# Patient Record
Sex: Female | Born: 1960 | Race: White | Hispanic: No | Marital: Married | Smoking: Never smoker
Health system: Southern US, Community
[De-identification: ages and names within clinical notes are randomized; demographics above are authoritative.]

## PROBLEM LIST (undated history)

## (undated) DIAGNOSIS — K6289 Other specified diseases of anus and rectum: Secondary | ICD-10-CM

## (undated) DIAGNOSIS — K625 Hemorrhage of anus and rectum: Secondary | ICD-10-CM

## (undated) DIAGNOSIS — K645 Perianal venous thrombosis: Secondary | ICD-10-CM

## (undated) DIAGNOSIS — S42309A Unspecified fracture of shaft of humerus, unspecified arm, initial encounter for closed fracture: Secondary | ICD-10-CM

## (undated) HISTORY — DX: Unspecified fracture of shaft of humerus, unspecified arm, initial encounter for closed fracture: S42.309A

## (undated) HISTORY — DX: Hemorrhage of anus and rectum: K62.5

## (undated) HISTORY — DX: Perianal venous thrombosis: K64.5

## (undated) HISTORY — DX: Other specified diseases of anus and rectum: K62.89

---
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
## 1998-01-16 ENCOUNTER — Other Ambulatory Visit: Admission: RE | Admit: 1998-01-16 | Discharge: 1998-01-16 | Payer: Self-pay | Admitting: Obstetrics & Gynecology

## 1999-01-24 ENCOUNTER — Other Ambulatory Visit: Admission: RE | Admit: 1999-01-24 | Discharge: 1999-01-24 | Payer: Self-pay | Admitting: Obstetrics & Gynecology

## 2000-02-02 ENCOUNTER — Other Ambulatory Visit: Admission: RE | Admit: 2000-02-02 | Discharge: 2000-02-02 | Payer: Self-pay | Admitting: Obstetrics & Gynecology

## 2001-02-01 ENCOUNTER — Other Ambulatory Visit: Admission: RE | Admit: 2001-02-01 | Discharge: 2001-02-01 | Payer: Self-pay | Admitting: Obstetrics & Gynecology

## 2002-04-10 ENCOUNTER — Other Ambulatory Visit: Admission: RE | Admit: 2002-04-10 | Discharge: 2002-04-10 | Payer: Self-pay | Admitting: Obstetrics & Gynecology

## 2003-04-18 ENCOUNTER — Other Ambulatory Visit: Admission: RE | Admit: 2003-04-18 | Discharge: 2003-04-18 | Payer: Self-pay | Admitting: Obstetrics & Gynecology

## 2004-04-30 ENCOUNTER — Other Ambulatory Visit: Admission: RE | Admit: 2004-04-30 | Discharge: 2004-04-30 | Payer: Self-pay | Admitting: Obstetrics & Gynecology

## 2004-07-11 ENCOUNTER — Ambulatory Visit (HOSPITAL_COMMUNITY): Admission: RE | Admit: 2004-07-11 | Discharge: 2004-07-11 | Payer: Self-pay | Admitting: Family Medicine

## 2005-05-18 ENCOUNTER — Other Ambulatory Visit: Admission: RE | Admit: 2005-05-18 | Discharge: 2005-05-18 | Payer: Self-pay | Admitting: Obstetrics & Gynecology

## 2007-06-16 ENCOUNTER — Encounter: Admission: RE | Admit: 2007-06-16 | Discharge: 2007-06-16 | Payer: Self-pay | Admitting: Obstetrics & Gynecology

## 2007-06-16 ENCOUNTER — Encounter (INDEPENDENT_AMBULATORY_CARE_PROVIDER_SITE_OTHER): Payer: Self-pay | Admitting: Radiology

## 2008-06-06 ENCOUNTER — Encounter: Admission: RE | Admit: 2008-06-06 | Discharge: 2008-06-06 | Payer: Self-pay | Admitting: Obstetrics & Gynecology

## 2009-06-12 ENCOUNTER — Encounter: Admission: RE | Admit: 2009-06-12 | Discharge: 2009-06-12 | Payer: Self-pay | Admitting: Obstetrics & Gynecology

## 2010-06-23 ENCOUNTER — Encounter: Admission: RE | Admit: 2010-06-23 | Discharge: 2010-06-23 | Payer: Self-pay | Admitting: Obstetrics & Gynecology

## 2010-06-27 ENCOUNTER — Encounter: Admission: RE | Admit: 2010-06-27 | Discharge: 2010-06-27 | Payer: Self-pay | Admitting: Obstetrics & Gynecology

## 2010-10-11 ENCOUNTER — Encounter: Payer: Self-pay | Admitting: Obstetrics & Gynecology

## 2011-05-13 ENCOUNTER — Other Ambulatory Visit: Payer: Self-pay | Admitting: Obstetrics & Gynecology

## 2011-05-13 DIAGNOSIS — Z1231 Encounter for screening mammogram for malignant neoplasm of breast: Secondary | ICD-10-CM

## 2011-06-11 ENCOUNTER — Ambulatory Visit (INDEPENDENT_AMBULATORY_CARE_PROVIDER_SITE_OTHER): Payer: PRIVATE HEALTH INSURANCE | Admitting: General Surgery

## 2011-06-11 ENCOUNTER — Encounter (INDEPENDENT_AMBULATORY_CARE_PROVIDER_SITE_OTHER): Payer: Self-pay | Admitting: General Surgery

## 2011-06-11 VITALS — BP 130/80 | HR 68 | Temp 97.2°F | Resp 16 | Ht 63.5 in | Wt 150.0 lb

## 2011-06-11 DIAGNOSIS — K645 Perianal venous thrombosis: Secondary | ICD-10-CM

## 2011-06-11 HISTORY — PX: HEMORROIDECTOMY: SUR656

## 2011-06-11 NOTE — Progress Notes (Signed)
Subjective:     Patient ID: Erika Ray, female   DOB: May 05, 1961, 50 y.o.   MRN: [id number]  HPI Patient with a one day history of increasing hemorrhoid pain. She was evaluated by her primary care physician. He found a thrombosed external hemorrhoid. We are seen her in the urgent clinic today. She has had no drainage. His history of some hemorrhoidal disease, however it has never been this bad in the past.  Review of Systems  Constitutional: Negative.   HENT: Negative.   Eyes: Negative.   Cardiovascular: Negative.   Genitourinary: Negative.   Musculoskeletal: Negative.        Objective:   Physical Exam  Eyes: Conjunctivae are normal. Pupils are equal, round, and reactive to light.  Cardiovascular: Normal rate and normal heart sounds.   Pulmonary/Chest: Effort normal and breath sounds normal. She has no wheezes.  Abdominal: Soft. She exhibits no distension. There is no tenderness.  External anal exam reveals large hemorrhoid with 2 thromboses on the right side. Pain limited for the rectal exam. There is some skin tags on the left side but no acute inflammation. There is no evidence of abscess. Procedure note: The area was prepped in a sterile fashion, local anesthetic was injected. 2 incisions were made over the 2 areas of thrombosis and large thrombosed external hemorrhoid large clot was removed from each side. Hemostasis was obtained with pressure and a gauze dressing was applied     Assessment:     Thrombosed external hemorrhoid    Plan:     This was incised as above. Sitz bath instructions were given. Local wound care. We'll see her back in a couple weeks. I gave her a note to be out of work tomorrow.

## 2011-06-12 ENCOUNTER — Telehealth (INDEPENDENT_AMBULATORY_CARE_PROVIDER_SITE_OTHER): Payer: Self-pay

## 2011-06-12 DIAGNOSIS — K645 Perianal venous thrombosis: Secondary | ICD-10-CM

## 2011-06-12 MED ORDER — HYDROCODONE-ACETAMINOPHEN 5-325 MG PO TABS: 1.0000 | ORAL_TABLET | ORAL | Status: DC | PRN

## 2011-06-12 NOTE — Telephone Encounter (Signed)
Pt called and is having some dull pain after her procedure yesterday for thrombosed hemorrhoids.  I spoke to Dr Janee Morn who gave an order for Hydrocodone protocol.  That was called in to the [facility] [phone]

## 2011-06-18 ENCOUNTER — Encounter (INDEPENDENT_AMBULATORY_CARE_PROVIDER_SITE_OTHER): Payer: Self-pay | Admitting: General Surgery

## 2011-06-18 ENCOUNTER — Ambulatory Visit (INDEPENDENT_AMBULATORY_CARE_PROVIDER_SITE_OTHER): Payer: PRIVATE HEALTH INSURANCE | Admitting: General Surgery

## 2011-06-18 VITALS — BP 118/80 | HR 68 | Temp 98.3°F | Resp 16 | Ht 63.5 in | Wt 151.2 lb

## 2011-06-18 DIAGNOSIS — K644 Residual hemorrhoidal skin tags: Secondary | ICD-10-CM

## 2011-06-18 MED ORDER — LIDOCAINE HCL 2 % EX GEL: CUTANEOUS | Status: AC

## 2011-06-18 NOTE — Patient Instructions (Signed)
GETTING TO GOOD BOWEL HEALTH. Irregular bowel habits such as constipation and diarrhea can lead to many problems over time.  Having one soft bowel movement a day is the most important way to prevent further problems.  The anorectal canal is designed to handle stretching and feces to safely manage our ability to get rid of solid waste (feces, poop, stool) out of our body.  BUT, hard constipated stools can act like ripping concrete bricks and diarrhea can be a burning fire to this very sensitive area of our body, causing inflamed hemorrhoids, anal fissures, increasing risk is perirectal abscesses, abdominal pain/bloating, an making irritable bowel worse.     The goal: ONE SOFT BOWEL MOVEMENT A DAY!  To have soft, regular bowel movements:    Drink at least 8 tall glasses of water a day.     Take plenty of fiber.  Fiber is the undigested part of plant food that passes into the colon, acting s "natures broom" to encourage bowel motility and movement.  Fiber can absorb and hold large amounts of water. This results in a larger, bulkier stool, which is soft and easier to pass. Work gradually over several weeks up to 6 servings a day of fiber (25g a day even more if needed) in the form of: o Vegetables -- Root (potatoes, carrots, turnips), leafy green (lettuce, salad greens, celery, spinach), or cooked high residue (cabbage, broccoli, etc) o Fruit -- Fresh (unpeeled skin & pulp), Dried (prunes, apricots, cherries, etc ),  or stewed ( applesauce)  o Whole grain breads, pasta, etc (whole wheat)  o Bran cereals    Bulking Agents -- This type of water-retaining fiber generally is easily obtained each day by one of the following:  o Psyllium bran -- The psyllium plant is remarkable because its ground seeds can retain so much water. This product is available as Metamucil, Konsyl, Effersyllium, Per Diem Fiber, or the less expensive generic preparation in drug and health food stores. Although labeled a laxative, it really  is not a laxative.  o Methylcellulose -- This is another fiber derived from wood which also retains water. It is available as Citrucel. o Polyethylene Glycol - and "artificial" fiber commonly called Miralax or Glycolax.  It is helpful for people with gassy or bloated feelings with regular fiber o Flax Seed - a less gassy fiber than psyllium o Benefiber   No reading or other relaxing activity while on the toilet. If bowel movements take longer than 5 minutes, you are too constipated   AVOID CONSTIPATION.  High fiber and water intake usually takes care of this.  Sometimes a laxative is needed to stimulate more frequent bowel movements, but    Laxatives are not a good long-term solution as it can wear the colon out. o Osmotics (Milk of Magnesia, Fleets phosphosoda, Magnesium citrate, MiraLax, GoLytely) are safer than  o Stimulants (Senokot, Castor Oil, Dulcolax, Ex Lax)    o Do not take laxatives for more than 7days in a row.    IF SEVERELY CONSTIPATED, try a Bowel Retraining Program: o Do not use laxatives.  o Eat a diet high in roughage, such as bran cereals and leafy vegetables.  o Drink six (6) ounces of prune or apricot juice each morning.  o Eat two (2) large servings of stewed fruit each day.  o Take one (1) heaping tablespoon of a psyllium-based bulking agent twice a day. Use sugar-free sweetener when possible to avoid excessive calories.  o Eat a normal breakfast.  o Set aside 15 minutes after breakfast to sit on the toilet, but do not strain to have a bowel movement.  o If you do not have a bowel movement by the third day, use an enema and repeat the above steps.  o

## 2011-06-19 NOTE — Progress Notes (Signed)
Chief complaint: I'm still having pain  History of present illness: 50 year old female underwent an incision and drainage of a right thrombosed external hemorrhoid on September 20 comes in complaining of continued pain in the area as well as some recurrent bleeding. She states that area has not gone down in size. She is taking the pain medicine fairly frequently. She denies any fevers or chills. She denies any dysuria or trouble urinating. She is taking a sitz bath infrequently. She is also having some constipation.  Physical exam: BP 118/80  Pulse 68  Temp(Src) 98.3 F (36.8 C) (Temporal)  Resp 16  Ht 5' 3.5" (1.613 m)  Wt 151 lb 3.2 oz (68.584 kg)  BMI 26.36 kg/m2  Well-developed well-nourished overweight female in no apparent distress Rectal-she has some indurated right external hemorrhoidal tissue in the anterior and posterior position. There is no signs of thrombosis. There is no cellulitis. There is no fluctuance. Digital rectal exam deferred  Assessment and plan: Status post incision and drainage of right thrombosed external hemorrhoids  There is no signs of infection. I've encouraged to increase her water intake. I also encouraged her to add fiber to her diet. She was given information about fiber supplements. I've asked her to try to minimize the narcotics. She is given a prescription for lidocaine 2% ointment. I've also encouraged her to continue doing sitz baths at least 3-4 times a day. She will followup with Dr. Janee Morn.

## 2011-06-29 ENCOUNTER — Ambulatory Visit
Admission: RE | Admit: 2011-06-29 | Discharge: 2011-06-29 | Disposition: A | Discharge status: Home / Self Care | Payer: Self-pay | Source: Ambulatory Visit | Attending: Obstetrics & Gynecology | Admitting: Obstetrics & Gynecology

## 2011-06-29 DIAGNOSIS — Z1231 Encounter for screening mammogram for malignant neoplasm of breast: Secondary | ICD-10-CM

## 2011-07-15 ENCOUNTER — Encounter (INDEPENDENT_AMBULATORY_CARE_PROVIDER_SITE_OTHER): Payer: PRIVATE HEALTH INSURANCE | Admitting: General Surgery

## 2011-08-31 ENCOUNTER — Other Ambulatory Visit: Payer: Self-pay | Admitting: Gastroenterology

## 2012-05-24 ENCOUNTER — Other Ambulatory Visit: Payer: Self-pay | Admitting: Obstetrics & Gynecology

## 2012-05-24 DIAGNOSIS — Z1231 Encounter for screening mammogram for malignant neoplasm of breast: Secondary | ICD-10-CM

## 2012-07-15 ENCOUNTER — Ambulatory Visit
Admission: RE | Admit: 2012-07-15 | Discharge: 2012-07-15 | Disposition: A | Discharge status: Home / Self Care | Payer: PRIVATE HEALTH INSURANCE | Source: Ambulatory Visit | Attending: Obstetrics & Gynecology | Admitting: Obstetrics & Gynecology

## 2012-07-15 DIAGNOSIS — Z1231 Encounter for screening mammogram for malignant neoplasm of breast: Secondary | ICD-10-CM

## 2012-08-08 ENCOUNTER — Other Ambulatory Visit: Payer: Self-pay | Admitting: Gastroenterology

## 2013-05-15 ENCOUNTER — Other Ambulatory Visit: Payer: Self-pay

## 2013-05-15 DIAGNOSIS — Z1231 Encounter for screening mammogram for malignant neoplasm of breast: Secondary | ICD-10-CM

## 2013-06-07 ENCOUNTER — Encounter: Payer: Self-pay | Admitting: *Deleted

## 2013-06-26 ENCOUNTER — Encounter: Payer: Self-pay | Admitting: Family Medicine

## 2013-06-26 ENCOUNTER — Ambulatory Visit (INDEPENDENT_AMBULATORY_CARE_PROVIDER_SITE_OTHER): Payer: 59 | Admitting: Family Medicine

## 2013-06-26 VITALS — BP 116/69 | HR 69 | Temp 98.1°F | Ht 62.0 in | Wt 157.0 lb

## 2013-06-26 DIAGNOSIS — E559 Vitamin D deficiency, unspecified: Secondary | ICD-10-CM

## 2013-06-26 DIAGNOSIS — Z0000 Encounter for general adult medical examination without abnormal findings: Secondary | ICD-10-CM

## 2013-06-26 LAB — POCT CBC
Granulocyte percent: 74.7 %G (ref 37–80)
HCT, POC: 42.4 % (ref 37.7–47.9)
Hemoglobin: 14.3 g/dL (ref 12.2–16.2)
Lymph, poc: 2 (ref 0.6–3.4)
MCH, POC: 29.9 pg (ref 27–31.2)
MCHC: 33.7 g/dL (ref 31.8–35.4)
MCV: 88.9 fL (ref 80–97)
MPV: 7.8 fL (ref 0–99.8)
POC Granulocyte: 6.4 (ref 2–6.9)
POC LYMPH PERCENT: 22.9 %L (ref 10–50)
Platelet Count, POC: 249 10*3/uL (ref 142–424)
RBC: 4.8 M/uL (ref 4.04–5.48)
RDW, POC: 12.6 %
WBC: 8.6 10*3/uL (ref 4.6–10.2)

## 2013-06-26 NOTE — Patient Instructions (Addendum)
Colonoscopy  A colonoscopy is an exam to evaluate your entire colon. In this exam, your colon is cleansed. A long fiberoptic tube is inserted through your rectum and into your colon. The fiberoptic scope (endoscope) is a long bundle of enclosed and very flexible fibers. These fibers transmit light to the area examined and send images from that area to your caregiver. Discomfort is usually minimal. You may be given a drug to help you sleep (sedative) during or prior to the procedure. This exam helps to detect lumps (tumors), polyps, inflammation, and areas of bleeding. Your caregiver may also take a small piece of tissue (biopsy) that will be examined under a microscope.  LET YOUR CAREGIVER KNOW ABOUT:   · Allergies to food or medicine.  · Medicines taken, including vitamins, herbs, eyedrops, over-the-counter medicines, and creams.  · Use of steroids (by mouth or creams).  · Previous problems with anesthetics or numbing medicines.  · History of bleeding problems or blood clots.  · Previous surgery.  · Other health problems, including diabetes and kidney problems.  · Possibility of pregnancy, if this applies.  BEFORE THE PROCEDURE   · A clear liquid diet may be required for 2 days before the exam.  · Ask your caregiver about changing or stopping your regular medications.  · Liquid injections (enemas) or laxatives may be required.  · A large amount of electrolyte solution may be given to you to drink over a short period of time. This solution is used to clean out your colon.  · You should be present 60 minutes prior to your procedure or as directed by your caregiver.  AFTER THE PROCEDURE   · If you received a sedative or pain relieving medication, you will need to arrange for someone to drive you home.  · Occasionally, there is a little blood passed with the first bowel movement. Do not be concerned.  FINDING OUT THE RESULTS OF YOUR TEST  Not all test results are available during your visit. If your test results are  not back during the visit, make an appointment with your caregiver to find out the results. Do not assume everything is normal if you have not heard from your caregiver or the medical facility. It is important for you to follow up on all of your test results.  HOME CARE INSTRUCTIONS   · It is not unusual to pass moderate amounts of gas and experience mild abdominal cramping following the procedure. This is due to air being used to inflate your colon during the exam. Walking or a warm pack on your belly (abdomen) may help.  · You may resume all normal meals and activities after sedatives and medicines have worn off.  · Only take over-the-counter or prescription medicines for pain, discomfort, or fever as directed by your caregiver. Do not use aspirin or blood thinners if a biopsy was taken. Consult your caregiver for medicine usage if biopsies were taken.  SEEK IMMEDIATE MEDICAL CARE IF:   · You have a fever.  · You pass large blood clots or fill a toilet with blood following the procedure. This may also occur 10 to 14 days following the procedure. This is more likely if a biopsy was taken.  · You develop abdominal pain that keeps getting worse and cannot be relieved with medicine.  Document Released: 09/04/2000 Document Revised: 11/30/2011 Document Reviewed: 04/19/2008  ExitCare® Patient Information ©2014 [employer].

## 2013-06-26 NOTE — Progress Notes (Signed)
  Subjective:    Patient ID: Margeart Allender, female    DOB: [birth year], 52 y.o.   MRN: [id number]  HPI  This 52 y.o. female presents for evaluation of CPE.  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 She is due for CPE labs.  She has had colonoscopy and had a polyp.  She is scheduled to have another Colonoscopy in 5 years.  Her mother died from ovarian cancer at agde 32. She declines flu shot.  Review of Systems No chest pain, SOB, HA, dizziness, vision change, N/V, diarrhea, constipation, dysuria, urinary urgency or frequency, myalgias, arthralgias or rash.     Objective:   Physical Exam Vital signs noted  Well developed well nourished female.  HEENT - Head atraumatic Normocephalic                Eyes - PERRLA, Conjuctiva - clear Sclera- Clear EOMI                Ears - EAC's Wnl TM's Wnl Gross Hearing WNL                Nose - Nares patent                 Throat - oropharanx wnl Respiratory - Lungs CTA bilateral Cardiac - RRR S1 and S2 without murmur GI - Abdomen soft Nontender and bowel sounds active x 4 Extremities - No edema. Neuro - Grossly intact.       Assessment & Plan:  Routine general medical examination at a health care facility - Plan: POCT CBC, CMP14+EGFR, Lipid panel, Thyroid Panel With TSH, Vit D  25 hydroxy (rtn osteoporosis monitoring)  Unspecified vitamin D deficiency - Check Vitamin D level.  Continue vitamin D 1000 iu po qd.  Deatra Canter FNP

## 2013-06-27 ENCOUNTER — Ambulatory Visit: Payer: Self-pay | Admitting: Family Medicine

## 2013-06-27 LAB — THYROID PANEL WITH TSH
Free Thyroxine Index: 2 (ref 1.2–4.9)
T3 Uptake Ratio: 20 % — ABNORMAL LOW (ref 24–39)
T4, Total: 9.8 ug/dL (ref 4.5–12.0)
TSH: 2.68 u[IU]/mL (ref 0.450–4.500)

## 2013-06-27 LAB — CMP14+EGFR
ALT: 10 IU/L (ref 0–32)
AST: 12 IU/L (ref 0–40)
Albumin/Globulin Ratio: 2.2 (ref 1.1–2.5)
Albumin: 4.3 g/dL (ref 3.5–5.5)
Alkaline Phosphatase: 76 IU/L (ref 39–117)
BUN/Creatinine Ratio: 17 (ref 9–23)
BUN: 11 mg/dL (ref 6–24)
CO2: 23 mmol/L (ref 18–29)
Calcium: 9.5 mg/dL (ref 8.7–10.2)
Chloride: 102 mmol/L (ref 97–108)
Creatinine, Ser: 0.63 mg/dL (ref 0.57–1.00)
GFR calc Af Amer: 120 mL/min/{1.73_m2} (ref >59)
GFR calc non Af Amer: 104 mL/min/{1.73_m2} (ref >59)
Globulin, Total: 2 g/dL (ref 1.5–4.5)
Glucose: 85 mg/dL (ref 65–99)
Potassium: 4.5 mmol/L (ref 3.5–5.2)
Sodium: 141 mmol/L (ref 134–144)
Total Bilirubin: 0.4 mg/dL (ref 0.0–1.2)
Total Protein: 6.3 g/dL (ref 6.0–8.5)

## 2013-06-27 LAB — LIPID PANEL
Chol/HDL Ratio: 2.6 ratio units (ref 0.0–4.4)
Cholesterol, Total: 272 mg/dL — ABNORMAL HIGH (ref 100–199)
HDL: 106 mg/dL (ref >39)
LDL Calculated: 146 mg/dL — ABNORMAL HIGH (ref 0–99)
Triglycerides: 102 mg/dL (ref 0–149)
VLDL Cholesterol Cal: 20 mg/dL (ref 5–40)

## 2013-06-27 LAB — VITAMIN D 25 HYDROXY (VIT D DEFICIENCY, FRACTURES): Vit D, 25-Hydroxy: 55 ng/mL (ref 30.0–100.0)

## 2013-06-28 ENCOUNTER — Ambulatory Visit: Payer: Self-pay | Admitting: Family Medicine

## 2013-07-19 ENCOUNTER — Ambulatory Visit
Admission: RE | Admit: 2013-07-19 | Discharge: 2013-07-19 | Disposition: A | Discharge status: Home / Self Care | Payer: PRIVATE HEALTH INSURANCE | Source: Ambulatory Visit

## 2013-07-19 ENCOUNTER — Other Ambulatory Visit: Payer: Self-pay

## 2013-07-19 DIAGNOSIS — Z1231 Encounter for screening mammogram for malignant neoplasm of breast: Secondary | ICD-10-CM

## 2013-07-28 ENCOUNTER — Encounter: Payer: Self-pay | Admitting: Family Medicine

## 2013-07-28 ENCOUNTER — Ambulatory Visit (INDEPENDENT_AMBULATORY_CARE_PROVIDER_SITE_OTHER): Payer: 59 | Admitting: Family Medicine

## 2013-07-28 VITALS — BP 109/67 | HR 80 | Temp 98.6°F | Ht 62.0 in | Wt 158.0 lb

## 2013-07-28 DIAGNOSIS — K645 Perianal venous thrombosis: Secondary | ICD-10-CM

## 2013-07-28 MED ORDER — HYDROCODONE-ACETAMINOPHEN 5-325 MG PO TABS: 1.0000 | ORAL_TABLET | ORAL | Status: AC | PRN

## 2013-07-28 MED ORDER — CYCLOBENZAPRINE HCL 10 MG PO TABS: 10.0000 mg | ORAL_TABLET | Freq: Three times a day (TID) | ORAL | Status: DC | PRN

## 2013-07-28 NOTE — Patient Instructions (Signed)
Back Pain, Adult Low back pain is very common. About 1 in 5 people have back pain.The cause of low back pain is rarely dangerous. The pain often gets better over time.About half of people with a sudden onset of back pain feel better in just 2 weeks. About 8 in 10 people feel better by 6 weeks.  CAUSES Some common causes of back pain include:  Strain of the muscles or ligaments supporting the spine.  Wear and tear (degeneration) of the spinal discs.  Arthritis.  Direct injury to the back. DIAGNOSIS Most of the time, the direct cause of low back pain is not known.However, back pain can be treated effectively even when the exact cause of the pain is unknown.Answering your caregiver's questions about your overall health and symptoms is one of the most accurate ways to make sure the cause of your pain is not dangerous. If your caregiver needs more information, he or she may order lab work or imaging tests (X-rays or MRIs).However, even if imaging tests show changes in your back, this usually does not require surgery. HOME CARE INSTRUCTIONS For many people, back pain returns.Since low back pain is rarely dangerous, it is often a condition that people can learn to manageon their own.   Remain active. It is stressful on the back to sit or stand in one place. Do not sit, drive, or stand in one place for more than 30 minutes at a time. Take short walks on level surfaces as soon as pain allows.Try to increase the length of time you walk each day.  Do not stay in bed.Resting more than 1 or 2 days can delay your recovery.  Do not avoid exercise or work.Your body is made to move.It is not dangerous to be active, even though your back may hurt.Your back will likely heal faster if you return to being active before your pain is gone.  Pay attention to your body when you bend and lift. Many people have less discomfortwhen lifting if they bend their knees, keep the load close to their bodies,and  avoid twisting. Often, the most comfortable positions are those that put less stress on your recovering back.  Find a comfortable position to sleep. Use a firm mattress and lie on your side with your knees slightly bent. If you lie on your back, put a pillow under your knees.  Only take over-the-counter or prescription medicines as directed by your caregiver. Over-the-counter medicines to reduce pain and inflammation are often the most helpful.Your caregiver may prescribe muscle relaxant drugs.These medicines help dull your pain so you can more quickly return to your normal activities and healthy exercise.  Put ice on the injured area.  Put ice in a plastic bag.  Place a towel between your skin and the bag.  Leave the ice on for 15-20 minutes, 03-04 times a day for the first 2 to 3 days. After that, ice and heat may be alternated to reduce pain and spasms.  Ask your caregiver about trying back exercises and gentle massage. This may be of some benefit.  Avoid feeling anxious or stressed.Stress increases muscle tension and can worsen back pain.It is important to recognize when you are anxious or stressed and learn ways to manage it.Exercise is a great option. SEEK MEDICAL CARE IF:  You have pain that is not relieved with rest or medicine.  You have pain that does not improve in 1 week.  You have new symptoms.  You are generally not feeling well. SEEK   IMMEDIATE MEDICAL CARE IF:   You have pain that radiates from your back into your legs.  You develop new bowel or bladder control problems.  You have unusual weakness or numbness in your arms or legs.  You develop nausea or vomiting.  You develop abdominal pain.  You feel faint. Document Released: 09/07/2005 Document Revised: 03/08/2012 Document Reviewed: 01/26/2011 ExitCare Patient Information 2014 [employer].  

## 2013-07-28 NOTE — Progress Notes (Signed)
  Subjective:    Patient ID: Erika Ray, female    DOB: 03/16/1961, 52 y.o.   MRN: [id number]  HPI This 52 y.o. female presents for evaluation of back pain in her thoracic spine. She has hx of injury in the past.  She has been laying on heating pad.  She does a lot Of bending at work.   Review of Systems C/o back pain No chest pain, SOB, HA, dizziness, vision change, N/V, diarrhea, constipation, dysuria, urinary urgency or frequency or rash.     Objective:   Physical Exam MS - TTP thoracic spine and decreased rom.       Assessment & Plan:  Back pain - flexeril and naprosyn and if not better follow up.  Deatra Canter FNP

## 2013-08-02 ENCOUNTER — Ambulatory Visit: Payer: 59 | Admitting: Family Medicine

## 2014-05-24 ENCOUNTER — Other Ambulatory Visit: Payer: Self-pay

## 2014-05-24 DIAGNOSIS — Z1231 Encounter for screening mammogram for malignant neoplasm of breast: Secondary | ICD-10-CM

## 2014-07-05 ENCOUNTER — Encounter: Payer: Self-pay | Admitting: Family Medicine

## 2014-07-05 ENCOUNTER — Ambulatory Visit (INDEPENDENT_AMBULATORY_CARE_PROVIDER_SITE_OTHER): Payer: 59 | Admitting: Family Medicine

## 2014-07-05 VITALS — BP 114/72 | HR 73 | Temp 97.5°F | Ht 62.0 in | Wt 149.6 lb

## 2014-07-05 DIAGNOSIS — R5383 Other fatigue: Secondary | ICD-10-CM

## 2014-07-05 DIAGNOSIS — Z0000 Encounter for general adult medical examination without abnormal findings: Secondary | ICD-10-CM

## 2014-07-05 LAB — POCT CBC
Granulocyte percent: 69.4 %G (ref 37–80)
HCT, POC: 41.2 % (ref 37.7–47.9)
Hemoglobin: 13.6 g/dL (ref 12.2–16.2)
Lymph, poc: 2.1 (ref 0.6–3.4)
MCH, POC: 29.9 pg (ref 27–31.2)
MCHC: 33 g/dL (ref 31.8–35.4)
MCV: 90.6 fL (ref 80–97)
MPV: 9.2 fL (ref 0–99.8)
POC Granulocyte: 5.5 (ref 2–6.9)
POC LYMPH PERCENT: 26.2 %L (ref 10–50)
Platelet Count, POC: 235 10*3/uL (ref 142–424)
RBC: 4.6 M/uL (ref 4.04–5.48)
RDW, POC: 12.8 %
WBC: 7.9 10*3/uL (ref 4.6–10.2)

## 2014-07-05 NOTE — Progress Notes (Signed)
   Subjective:    Patient ID: Erika Ray, female    DOB: 31-Dec-1960, 53 y.o.   MRN: [id number]  HPI  This 53 y.o. female presents for evaluation of exam.  She has been taking OCP's and is planning to stop this year after her husband gets vasectomy.  Review of Systems No chest pain, SOB, HA, dizziness, vision change, N/V, diarrhea, constipation, dysuria, urinary urgency or frequency, myalgias, arthralgias or rash.     Objective:   Physical Exam Vital signs noted  Well developed well nourished female.  HEENT - Head atraumatic Normocephalic                Eyes - PERRLA, Conjuctiva - clear Sclera- Clear EOMI                Ears - EAC's Wnl TM's Wnl Gross Hearing WNL                Nose - Nares patent                 Throat - oropharanx wnl Respiratory - Lungs CTA bilateral Cardiac - RRR S1 and S2 without murmur GI - Abdomen soft Nontender and bowel sounds active x 4 Extremities - No edema. Neuro - Grossly intact.       Assessment & Plan:  Other fatigue - Plan: POCT CBC, CMP14+EGFR, Thyroid Panel With TSH  Wellness examination - Plan: POCT CBC, CMP14+EGFR, Lipid panel, Thyroid Panel With TSH  Lysbeth Penner FNP

## 2014-07-06 LAB — LIPID PANEL
Chol/HDL Ratio: 2.7 ratio units (ref 0.0–4.4)
Cholesterol, Total: 240 mg/dL — ABNORMAL HIGH (ref 100–199)
HDL: 89 mg/dL (ref >39)
LDL Calculated: 124 mg/dL — ABNORMAL HIGH (ref 0–99)
Triglycerides: 137 mg/dL (ref 0–149)
VLDL Cholesterol Cal: 27 mg/dL (ref 5–40)

## 2014-07-06 LAB — CMP14+EGFR
ALT: 11 IU/L (ref 0–32)
AST: 15 IU/L (ref 0–40)
Albumin/Globulin Ratio: 1.7 (ref 1.1–2.5)
Albumin: 4 g/dL (ref 3.5–5.5)
Alkaline Phosphatase: 65 IU/L (ref 39–117)
BUN/Creatinine Ratio: 17 (ref 9–23)
BUN: 12 mg/dL (ref 6–24)
CO2: 24 mmol/L (ref 18–29)
Calcium: 8.9 mg/dL (ref 8.7–10.2)
Chloride: 103 mmol/L (ref 97–108)
Creatinine, Ser: 0.69 mg/dL (ref 0.57–1.00)
GFR calc Af Amer: 116 mL/min/{1.73_m2} (ref >59)
GFR calc non Af Amer: 100 mL/min/{1.73_m2} (ref >59)
Globulin, Total: 2.3 g/dL (ref 1.5–4.5)
Glucose: 84 mg/dL (ref 65–99)
Potassium: 4.5 mmol/L (ref 3.5–5.2)
Sodium: 139 mmol/L (ref 134–144)
Total Bilirubin: 0.3 mg/dL (ref 0.0–1.2)
Total Protein: 6.3 g/dL (ref 6.0–8.5)

## 2014-07-06 LAB — THYROID PANEL WITH TSH
Free Thyroxine Index: 2.4 (ref 1.2–4.9)
T3 Uptake Ratio: 21 % — ABNORMAL LOW (ref 24–39)
T4, Total: 11.3 ug/dL (ref 4.5–12.0)
TSH: 3.74 u[IU]/mL (ref 0.450–4.500)

## 2014-07-09 ENCOUNTER — Other Ambulatory Visit: Payer: Self-pay | Admitting: Family Medicine

## 2014-07-09 ENCOUNTER — Telehealth: Payer: Self-pay | Admitting: Family Medicine

## 2014-07-09 NOTE — Telephone Encounter (Signed)
Aware of lab results  

## 2014-07-09 NOTE — Telephone Encounter (Signed)
Message copied by Almeta MonasSTONE, Telia Amundson M on Mon Jul 09, 2014  4:46 PM ------      Message from: Deatra CanterXFORD, WILLIAM J      Created: Mon Jul 09, 2014  4:28 PM       Labs ok ------

## 2014-07-18 ENCOUNTER — Telehealth: Payer: Self-pay | Admitting: Family Medicine

## 2014-07-18 ENCOUNTER — Other Ambulatory Visit: Payer: Self-pay | Admitting: Family Medicine

## 2014-07-18 MED ORDER — SELENIUM SULFIDE 2.25 % EX SHAM: 1.0000 "application " | MEDICATED_SHAMPOO | CUTANEOUS | Status: DC

## 2014-07-18 NOTE — Telephone Encounter (Signed)
rx sent to pharmacy

## 2014-07-18 NOTE — Telephone Encounter (Signed)
Rx sent in patient aware 

## 2014-08-01 ENCOUNTER — Ambulatory Visit
Admission: RE | Admit: 2014-08-01 | Discharge: 2014-08-01 | Disposition: A | Discharge status: Home / Self Care | Payer: PRIVATE HEALTH INSURANCE | Source: Ambulatory Visit

## 2014-08-01 DIAGNOSIS — Z1231 Encounter for screening mammogram for malignant neoplasm of breast: Secondary | ICD-10-CM

## 2015-01-07 ENCOUNTER — Encounter: Payer: 59 | Admitting: Family

## 2015-01-30 ENCOUNTER — Encounter: Payer: 59 | Admitting: Family

## 2015-01-30 ENCOUNTER — Encounter: Payer: 59 | Admitting: Family Medicine

## 2015-03-08 ENCOUNTER — Encounter: Payer: Self-pay | Admitting: Family Medicine

## 2015-03-08 ENCOUNTER — Ambulatory Visit (INDEPENDENT_AMBULATORY_CARE_PROVIDER_SITE_OTHER): Payer: BLUE CROSS/BLUE SHIELD | Admitting: Family Medicine

## 2015-03-08 VITALS — BP 112/78 | HR 62 | Temp 97.7°F | Ht 62.0 in | Wt 152.0 lb

## 2015-03-08 DIAGNOSIS — Z0000 Encounter for general adult medical examination without abnormal findings: Secondary | ICD-10-CM

## 2015-03-08 NOTE — Progress Notes (Signed)
   Subjective:    Patient ID: Erika Ray, female    DOB: 02-26-1961, 54 y.o.   MRN: [id number]  HPI 54 year old female here at the request of her health insurance for a physical exam. Basically Erika Ray is healthy. She is on no medications except vitamin supplements. Blood pressure is good. Her BMI is somewhat elevated and we discussed weight loss. Review of her labs from last year shows a high level of HDL, good cholesterol, normal glucose renal function, and thyroid function. There is no concerning family history for inheritable diseases or cancers.    Review of Systems  Constitutional: Negative.   HENT: Negative.   Eyes: Negative.   Respiratory: Negative.   Cardiovascular: Negative.   Gastrointestinal: Negative.   Endocrine: Negative.   Genitourinary: Negative.   Hematological: Negative.   Psychiatric/Behavioral: Negative.    There are no active problems to display for this patient.  Outpatient Encounter Prescriptions as of 03/08/2015  Medication Sig  . aspirin 81 MG tablet Take 81 mg by mouth daily.  . cholecalciferol (VITAMIN D) 1000 UNITS tablet Take 1,000 Units by mouth. 1 tablet every 5 days per GYN  . [DISCONTINUED] cyclobenzaprine (FLEXERIL) 10 MG tablet Take 1 tablet (10 mg total) by mouth 3 (three) times daily as needed for muscle spasms.  . [DISCONTINUED] desogestrel-ethinyl estradiol (KARIVA,AZURETTE,MIRCETTE) 0.15-0.02/0.01 MG (21/5) tablet Take 1 tablet by mouth daily.    . [DISCONTINUED] Selenium Sulfide 2.25 % SHAM Apply 1 application topically 1 day or 1 dose.   No facility-administered encounter medications on file as of 03/08/2015.       Objective:   Physical Exam  Constitutional: She is oriented to person, place, and time. She appears well-developed and well-nourished.  HENT:  Head: Normocephalic.  Eyes: Pupils are equal, round, and reactive to light.  Neck: Normal range of motion.  Cardiovascular: Normal rate, regular rhythm and intact distal pulses.     Pulmonary/Chest: Effort normal and breath sounds normal.  Abdominal: Soft. There is no tenderness.  Musculoskeletal: Normal range of motion.  Neurological: She is alert and oriented to person, place, and time. She has normal reflexes.  Psychiatric: She has a normal mood and affect.          Assessment & Plan:  1. Health care maintenance Exam is normal today. Did discuss weight loss. Since her BMI is 27 would like to be closer to 24, 25. Also recommended calcium and vitamin D since she is menopausal. She does have some hot flashes but these are not bad at this point. Suggested if they do worsen may try some soy preparation - CMP14+EGFR  Wardell Honour MD - Lipid panel

## 2015-03-09 LAB — CMP14+EGFR
ALT: 110 IU/L — ABNORMAL HIGH (ref 0–32)
AST: 64 IU/L — ABNORMAL HIGH (ref 0–40)
Albumin/Globulin Ratio: 2.2 (ref 1.1–2.5)
Albumin: 4.4 g/dL (ref 3.5–5.5)
Alkaline Phosphatase: 91 IU/L (ref 39–117)
BUN/Creatinine Ratio: 16 (ref 9–23)
BUN: 11 mg/dL (ref 6–24)
Bilirubin Total: 0.5 mg/dL (ref 0.0–1.2)
CO2: 25 mmol/L (ref 18–29)
Calcium: 9.5 mg/dL (ref 8.7–10.2)
Chloride: 101 mmol/L (ref 97–108)
Creatinine, Ser: 0.68 mg/dL (ref 0.57–1.00)
GFR calc Af Amer: 115 mL/min/{1.73_m2} (ref >59)
GFR calc non Af Amer: 100 mL/min/{1.73_m2} (ref >59)
Globulin, Total: 2 g/dL (ref 1.5–4.5)
Glucose: 82 mg/dL (ref 65–99)
Potassium: 4.6 mmol/L (ref 3.5–5.2)
Sodium: 140 mmol/L (ref 134–144)
Total Protein: 6.4 g/dL (ref 6.0–8.5)

## 2015-03-09 LAB — LIPID PANEL
Chol/HDL Ratio: 2.4 ratio units (ref 0.0–4.4)
Cholesterol, Total: 259 mg/dL — ABNORMAL HIGH (ref 100–199)
HDL: 108 mg/dL (ref >39)
LDL Calculated: 137 mg/dL — ABNORMAL HIGH (ref 0–99)
Triglycerides: 72 mg/dL (ref 0–149)
VLDL Cholesterol Cal: 14 mg/dL (ref 5–40)

## 2015-03-11 ENCOUNTER — Telehealth: Payer: Self-pay | Admitting: *Deleted

## 2015-03-11 NOTE — Telephone Encounter (Signed)
Call for results

## 2015-03-12 NOTE — Progress Notes (Signed)
Patient aware.

## 2015-03-22 ENCOUNTER — Other Ambulatory Visit (INDEPENDENT_AMBULATORY_CARE_PROVIDER_SITE_OTHER): Payer: BLUE CROSS/BLUE SHIELD

## 2015-03-22 DIAGNOSIS — R7989 Other specified abnormal findings of blood chemistry: Secondary | ICD-10-CM

## 2015-03-22 DIAGNOSIS — R945 Abnormal results of liver function studies: Principal | ICD-10-CM

## 2015-03-22 NOTE — Progress Notes (Signed)
Lab only 

## 2015-03-23 LAB — HEPATIC FUNCTION PANEL
ALK PHOS: 94 IU/L (ref 39–117)
ALT: 91 IU/L — AB (ref 0–32)
AST: 58 IU/L — ABNORMAL HIGH (ref 0–40)
Albumin: 4.4 g/dL (ref 3.5–5.5)
BILIRUBIN TOTAL: 0.5 mg/dL (ref 0.0–1.2)
Bilirubin, Direct: 0.12 mg/dL (ref 0.00–0.40)
Total Protein: 6.8 g/dL (ref 6.0–8.5)

## 2015-03-27 ENCOUNTER — Telehealth: Payer: Self-pay | Admitting: Family Medicine

## 2015-03-27 NOTE — Progress Notes (Signed)
Patient aware. Message put in to have nurse call her back. She has questions about her labs.

## 2015-03-28 NOTE — Telephone Encounter (Signed)
Spoke with pt regarding labs Verbalizes understanding 

## 2015-04-25 ENCOUNTER — Other Ambulatory Visit (INDEPENDENT_AMBULATORY_CARE_PROVIDER_SITE_OTHER): Payer: BLUE CROSS/BLUE SHIELD

## 2015-04-25 DIAGNOSIS — R7989 Other specified abnormal findings of blood chemistry: Secondary | ICD-10-CM

## 2015-04-25 DIAGNOSIS — R945 Abnormal results of liver function studies: Principal | ICD-10-CM

## 2015-04-25 NOTE — Progress Notes (Signed)
Lab only 

## 2015-04-26 LAB — HEPATIC FUNCTION PANEL
ALT: 38 IU/L — ABNORMAL HIGH (ref 0–32)
AST: 36 IU/L (ref 0–40)
Albumin: 4.6 g/dL (ref 3.5–5.5)
Alkaline Phosphatase: 89 IU/L (ref 39–117)
BILIRUBIN TOTAL: 0.5 mg/dL (ref 0.0–1.2)
Bilirubin, Direct: 0.14 mg/dL (ref 0.00–0.40)
Total Protein: 7.1 g/dL (ref 6.0–8.5)

## 2015-05-08 ENCOUNTER — Telehealth: Payer: Self-pay | Admitting: Family Medicine

## 2015-06-14 ENCOUNTER — Other Ambulatory Visit: Payer: Self-pay

## 2015-06-14 DIAGNOSIS — Z1231 Encounter for screening mammogram for malignant neoplasm of breast: Secondary | ICD-10-CM

## 2015-08-26 ENCOUNTER — Telehealth: Payer: Self-pay | Admitting: Family Medicine

## 2015-08-26 NOTE — Telephone Encounter (Signed)
SPOKE TO PT

## 2015-08-28 ENCOUNTER — Ambulatory Visit: Payer: PRIVATE HEALTH INSURANCE

## 2015-08-28 ENCOUNTER — Ambulatory Visit
Admission: RE | Admit: 2015-08-28 | Discharge: 2015-08-28 | Disposition: A | Discharge status: Home / Self Care | Payer: BLUE CROSS/BLUE SHIELD | Source: Ambulatory Visit

## 2015-08-28 DIAGNOSIS — Z1231 Encounter for screening mammogram for malignant neoplasm of breast: Secondary | ICD-10-CM

## 2015-09-02 ENCOUNTER — Ambulatory Visit (INDEPENDENT_AMBULATORY_CARE_PROVIDER_SITE_OTHER): Payer: BLUE CROSS/BLUE SHIELD

## 2015-09-02 DIAGNOSIS — Z23 Encounter for immunization: Secondary | ICD-10-CM

## 2015-10-29 ENCOUNTER — Encounter: Payer: Self-pay | Admitting: Family Medicine

## 2015-10-29 ENCOUNTER — Ambulatory Visit (INDEPENDENT_AMBULATORY_CARE_PROVIDER_SITE_OTHER): Payer: BLUE CROSS/BLUE SHIELD | Admitting: Family Medicine

## 2015-10-29 VITALS — BP 104/66 | HR 62 | Temp 97.2°F | Ht 62.0 in | Wt 154.0 lb

## 2015-10-29 DIAGNOSIS — M549 Dorsalgia, unspecified: Secondary | ICD-10-CM

## 2015-10-29 DIAGNOSIS — M546 Pain in thoracic spine: Secondary | ICD-10-CM

## 2015-10-29 MED ORDER — VALACYCLOVIR HCL 500 MG PO TABS: 1000.0000 mg | ORAL_TABLET | Freq: Two times a day (BID) | ORAL | Status: DC

## 2015-10-29 MED ORDER — CYCLOBENZAPRINE HCL 10 MG PO TABS: 10.0000 mg | ORAL_TABLET | Freq: Every day | ORAL | Status: DC

## 2015-10-29 NOTE — Progress Notes (Signed)
   Subjective:    Patient ID: Erika Ray, female    DOB: [birth year], 55 y.o.   MRN: [id number]  HPI Patient here today for upper back pain that was first noticed yesterday morning. At work, she lifted some heavy copper the day prior to the onset of this discomfort. She may have strained muscles or tendons at that time. She also worries about the contribution of stress. Has been under more stress with illness of a family member and her husband looking for a new job. Her pain does not really seem to be as much in the trapezius muscles and neck his of his interscapular.      There are no active problems to display for this patient.  Outpatient Encounter Prescriptions as of 10/29/2015  Medication Sig  . cholecalciferol (VITAMIN D) 1000 UNITS tablet Take 1,000 Units by mouth. 1 tablet every 5 days per GYN  . [DISCONTINUED] aspirin 81 MG tablet Take 81 mg by mouth daily.   No facility-administered encounter medications on file as of 10/29/2015.      Review of Systems  Constitutional: Negative.   HENT: Negative.   Eyes: Negative.   Respiratory: Negative.   Cardiovascular: Negative.   Gastrointestinal: Negative.   Endocrine: Negative.   Genitourinary: Negative.   Musculoskeletal: Positive for back pain (upper back ).  Skin: Negative.   Allergic/Immunologic: Negative.   Neurological: Negative.   Hematological: Negative.   Psychiatric/Behavioral: Negative.        Objective:   Physical Exam  Constitutional: She appears well-developed and well-nourished.  Musculoskeletal: Normal range of motion. She exhibits tenderness.  Right interscapular his tender especially when tendon was rolled under finger. There is a trigger point which was palpated in this area was injected with Kenalog and Marcaine. She tolerated this well  Neurological: She is alert.   BP 104/66 mmHg  Pulse 62  Temp(Src) 97.2 F (36.2 C) (Oral)  Ht  (1.575 m)  Wt 154 lb (69.854 kg)  BMI 28.16 kg/m2  LMP  10/22/2014        Assessment & Plan:  1. Pain, upper back I suspect this is related to injury with resultant tendinitis and/or muscle spasm. I injected the area as noted above but also gave her a small prescription for Flexeril and suggested use of Aleve as well. She will call or return if symptoms do not resolve  Frederica Kuster MD

## 2016-03-26 ENCOUNTER — Ambulatory Visit (INDEPENDENT_AMBULATORY_CARE_PROVIDER_SITE_OTHER): Payer: BLUE CROSS/BLUE SHIELD | Admitting: Family Medicine

## 2016-03-26 ENCOUNTER — Encounter: Payer: Self-pay | Admitting: Family Medicine

## 2016-03-26 VITALS — BP 100/70 | HR 65 | Temp 97.6°F | Ht 62.0 in | Wt 156.8 lb

## 2016-03-26 DIAGNOSIS — E785 Hyperlipidemia, unspecified: Secondary | ICD-10-CM

## 2016-03-26 DIAGNOSIS — Z0000 Encounter for general adult medical examination without abnormal findings: Secondary | ICD-10-CM | POA: Diagnosis not present

## 2016-03-26 MED ORDER — AZITHROMYCIN 250 MG PO TABS: ORAL_TABLET | ORAL | Status: DC

## 2016-03-26 NOTE — Progress Notes (Signed)
   Subjective:    Patient ID: Erika Ray, female    DOB: [birth year], 55 y.o.   MRN: [id number]  HPI 55 year old female here for annual exam. Her workplace request that she have the exam. She is having some sinus drainage and cough that is productive of yellowish sputum. She is up-to-date on preventive care including Pap smear mammogram and colonoscopies. Lipids were last assessed one year ago. She has a high level of HDL with some slight elevation of LDL. Will reassess again today. Only other significant event in her life her dad died about 3 months ago. Says she is doing okay with the grieving.  There are no active problems to display for this patient.  Outpatient Encounter Prescriptions as of 03/26/2016  Medication Sig  . cholecalciferol (VITAMIN D) 1000 UNITS tablet Take 1,000 Units by mouth. 1 tablet every 5 days per GYN  . cyclobenzaprine (FLEXERIL) 10 MG tablet Take 1 tablet (10 mg total) by mouth at bedtime.  . valACYclovir (VALTREX) 500 MG tablet Take 2 tablets (1,000 mg total) by mouth 2 (two) times daily.   No facility-administered encounter medications on file as of 03/26/2016.      Review of Systems  Constitutional: Negative.   Cardiovascular: Negative.   Gastrointestinal: Negative.   Genitourinary: Negative.   Neurological: Negative.        Objective:   Physical Exam  Constitutional: She is oriented to person, place, and time. She appears well-developed and well-nourished.  HENT:  Maxillary sinus tenderness  Cardiovascular: Normal rate, regular rhythm and normal heart sounds.   Pulmonary/Chest: Effort normal and breath sounds normal.  Abdominal: Soft.  Musculoskeletal: Normal range of motion.  Neurological: She is alert and oriented to person, place, and time.  Psychiatric: She has a normal mood and affect. Her behavior is normal.   Temp(Src) 97.6 F (36.4 C) (Oral)  Ht '5\' 2"'$  (1.575 m)  Wt 156 lb 12.8 oz (71.124 kg)  BMI 28.67 kg/m2        Assessment &  Plan:  1. Hyperlipidemia We'll reassess and applied to risk assessment formula but I do not think she will need treatment based on absence of other risk factors - CMP14+EGFR - Lipid panel  2. Annual physical exam Exam  is normal except for some sinus pain. Will treat this with a Z-Pak  Wardell Honour MD

## 2016-03-27 LAB — CMP14+EGFR
ALT: 12 IU/L (ref 0–32)
AST: 17 IU/L (ref 0–40)
Albumin/Globulin Ratio: 2.1 (ref 1.2–2.2)
Albumin: 4.5 g/dL (ref 3.5–5.5)
Alkaline Phosphatase: 102 IU/L (ref 39–117)
BUN/Creatinine Ratio: 17 (ref 9–23)
BUN: 10 mg/dL (ref 6–24)
Bilirubin Total: 0.3 mg/dL (ref 0.0–1.2)
CO2: 26 mmol/L (ref 18–29)
CREATININE: 0.59 mg/dL (ref 0.57–1.00)
Calcium: 9.4 mg/dL (ref 8.7–10.2)
Chloride: 104 mmol/L (ref 96–106)
GFR calc Af Amer: 120 mL/min/{1.73_m2} (ref >59)
GFR calc non Af Amer: 104 mL/min/{1.73_m2} (ref >59)
GLOBULIN, TOTAL: 2.1 g/dL (ref 1.5–4.5)
GLUCOSE: 97 mg/dL (ref 65–99)
Potassium: 4.9 mmol/L (ref 3.5–5.2)
SODIUM: 142 mmol/L (ref 134–144)
Total Protein: 6.6 g/dL (ref 6.0–8.5)

## 2016-03-27 LAB — LIPID PANEL
CHOLESTEROL TOTAL: 249 mg/dL — AB (ref 100–199)
Chol/HDL Ratio: 2.9 ratio units (ref 0.0–4.4)
HDL: 85 mg/dL (ref >39)
LDL CALC: 137 mg/dL — AB (ref 0–99)
Triglycerides: 133 mg/dL (ref 0–149)
VLDL Cholesterol Cal: 27 mg/dL (ref 5–40)

## 2016-03-30 ENCOUNTER — Telehealth: Payer: Self-pay | Admitting: Family Medicine

## 2016-05-26 DIAGNOSIS — H578 Other specified disorders of eye and adnexa: Secondary | ICD-10-CM | POA: Diagnosis not present

## 2016-06-09 ENCOUNTER — Other Ambulatory Visit: Payer: Self-pay | Admitting: Obstetrics & Gynecology

## 2016-06-09 DIAGNOSIS — Z1231 Encounter for screening mammogram for malignant neoplasm of breast: Secondary | ICD-10-CM

## 2016-07-06 ENCOUNTER — Other Ambulatory Visit: Payer: Self-pay | Admitting: Family Medicine

## 2016-07-06 DIAGNOSIS — Z1283 Encounter for screening for malignant neoplasm of skin: Secondary | ICD-10-CM | POA: Diagnosis not present

## 2016-07-06 DIAGNOSIS — X32XXXA Exposure to sunlight, initial encounter: Secondary | ICD-10-CM | POA: Diagnosis not present

## 2016-07-06 DIAGNOSIS — D225 Melanocytic nevi of trunk: Secondary | ICD-10-CM | POA: Diagnosis not present

## 2016-07-06 DIAGNOSIS — L570 Actinic keratosis: Secondary | ICD-10-CM | POA: Diagnosis not present

## 2016-08-17 DIAGNOSIS — H04123 Dry eye syndrome of bilateral lacrimal glands: Secondary | ICD-10-CM | POA: Diagnosis not present

## 2016-08-21 ENCOUNTER — Ambulatory Visit (INDEPENDENT_AMBULATORY_CARE_PROVIDER_SITE_OTHER): Payer: BLUE CROSS/BLUE SHIELD | Admitting: Physician Assistant

## 2016-08-21 ENCOUNTER — Encounter: Payer: Self-pay | Admitting: Physician Assistant

## 2016-08-21 VITALS — BP 104/79 | HR 70 | Temp 98.1°F | Ht 62.0 in | Wt 157.4 lb

## 2016-08-21 DIAGNOSIS — J0100 Acute maxillary sinusitis, unspecified: Secondary | ICD-10-CM | POA: Diagnosis not present

## 2016-08-21 DIAGNOSIS — R059 Cough, unspecified: Secondary | ICD-10-CM

## 2016-08-21 DIAGNOSIS — R05 Cough: Secondary | ICD-10-CM | POA: Diagnosis not present

## 2016-08-21 MED ORDER — HYDROCODONE-HOMATROPINE 5-1.5 MG/5ML PO SYRP: 5.0000 mL | ORAL_SOLUTION | Freq: Four times a day (QID) | ORAL | 0 refills | Status: DC | PRN

## 2016-08-21 MED ORDER — AZITHROMYCIN 250 MG PO TABS: ORAL_TABLET | ORAL | 0 refills | Status: DC

## 2016-08-21 NOTE — Patient Instructions (Signed)

## 2016-08-24 NOTE — Progress Notes (Signed)
BP 104/79   Pulse 70   Temp 98.1 F (36.7 C) (Oral)   Ht 5\' 2"  (1.575 m)   Wt 157 lb 6.4 oz (71.4 kg)   LMP 10/22/2014   BMI 28.79 kg/m    Subjective:    Patient ID: Erika Ray Cen, female    DOB: 12/18/1960, 55 y.o.   MRN: [id number]  HPI: Erika Ray Lowdermilk is a 55 y.o. female presenting on 08/21/2016 for Cough and head and chest congestion  Greater than one week of sinus drainage, headache and cough. Has headache on front along with sore throat. Some color to mucus, but no bleeding. Denies fever. No OTC meds have helped/  Relevant past medical, surgical, family and social history reviewed and updated as indicated. Allergies and medications reviewed and updated.  Past Medical History:  Diagnosis Date  . Broken arm 08/1966   left  . Rectal bleeding   . Rectal pain   . Thrombosed hemorrhoids     Past Surgical History:  Procedure Laterality Date  . FRACTURE SURGERY  1967   left arm  . HEMORROIDECTOMY  06/11/11   thromb hems    Review of Systems  Constitutional: Positive for fatigue and fever. Negative for activity change and appetite change.  HENT: Positive for congestion, postnasal drip, sinus pain, sinus pressure and sore throat.   Eyes: Negative.   Respiratory: Positive for cough. Negative for shortness of breath and wheezing.   Gastrointestinal: Negative.   Genitourinary: Negative.       Medication List       Accurate as of 08/21/16 11:59 PM. Always use your most recent med list.          azithromycin 250 MG tablet Commonly known as:  ZITHROMAX Z-PAK As directed   cholecalciferol 1000 units tablet Commonly known as:  VITAMIN D Take 1,000 Units by mouth. 1 tablet every 5 days per GYN   cyclobenzaprine 10 MG tablet Commonly known as:  FLEXERIL Take 1 tablet (10 mg total) by mouth at bedtime.   HYDROcodone-homatropine 5-1.5 MG/5ML syrup Commonly known as:  HYCODAN Take 5-10 mLs by mouth every 6 (six) hours as needed for cough.   valACYclovir 500 MG  tablet Commonly known as:  VALTREX TAKE (2) TABLETS TWICE DAILY.          Objective:    BP 104/79   Pulse 70   Temp 98.1 F (36.7 C) (Oral)   Ht 5\' 2"  (1.575 m)   Wt 157 lb 6.4 oz (71.4 kg)   LMP 10/22/2014   BMI 28.79 kg/m   Allergies  Allergen Reactions  . Ampicillin Rash  . Penicillins Hives    All over the body, except the face.    Physical Exam  Constitutional: She is oriented to person, place, and time. She appears well-developed and well-nourished.  HENT:  Head: Normocephalic and atraumatic.  Right Ear: Tympanic membrane and external ear normal. No middle ear effusion.  Left Ear: Tympanic membrane and external ear normal.  No middle ear effusion.  Nose: Mucosal edema and rhinorrhea present. Right sinus exhibits no maxillary sinus tenderness. Left sinus exhibits no maxillary sinus tenderness.  Mouth/Throat: Uvula is midline. Posterior oropharyngeal erythema present.  Eyes: Conjunctivae and EOM are normal. Pupils are equal, round, and reactive to light. Right eye exhibits no discharge. Left eye exhibits no discharge.  Neck: Normal range of motion.  Cardiovascular: Normal rate, regular rhythm and normal heart sounds.   Pulmonary/Chest: Effort normal and breath sounds normal. No respiratory distress.  She has no wheezes.  Abdominal: Soft.  Lymphadenopathy:    She has no cervical adenopathy.  Neurological: She is alert and oriented to person, place, and time.  Skin: Skin is warm and dry.  Psychiatric: She has a normal mood and affect.  Nursing note and vitals reviewed.       Assessment & Plan:   1. Acute non-recurrent maxillary sinusitis - azithromycin (ZITHROMAX Z-PAK) 250 MG tablet; As directed  Dispense: 6 tablet; Refill: 0 - HYDROcodone-homatropine (HYCODAN) 5-1.5 MG/5ML syrup; Take 5-10 mLs by mouth every 6 (six) hours as needed for cough.  Dispense: 120 mL; Refill: 0  2. Cough - azithromycin (ZITHROMAX Z-PAK) 250 MG tablet; As directed  Dispense: 6  tablet; Refill: 0 - HYDROcodone-homatropine (HYCODAN) 5-1.5 MG/5ML syrup; Take 5-10 mLs by mouth every 6 (six) hours as needed for cough.  Dispense: 120 mL; Refill: 0   Continue all other maintenance medications as listed above.  Follow up plan: Return if symptoms worsen or fail to improve.  Educational handout given for sinusitis  Remus LofflerAngel S. Jia Dottavio PA-C [facility] [address]  [place], [place] [id number] [phone]   08/24/2016, 10:39 PM

## 2016-09-01 ENCOUNTER — Ambulatory Visit: Payer: BLUE CROSS/BLUE SHIELD

## 2016-09-09 ENCOUNTER — Ambulatory Visit
Admission: RE | Admit: 2016-09-09 | Discharge: 2016-09-09 | Disposition: A | Discharge status: Home / Self Care | Payer: BLUE CROSS/BLUE SHIELD | Source: Ambulatory Visit | Attending: Obstetrics & Gynecology | Admitting: Obstetrics & Gynecology

## 2016-09-09 ENCOUNTER — Other Ambulatory Visit: Payer: Self-pay | Admitting: Obstetrics & Gynecology

## 2016-09-09 DIAGNOSIS — Z1231 Encounter for screening mammogram for malignant neoplasm of breast: Secondary | ICD-10-CM | POA: Diagnosis not present

## 2016-09-09 DIAGNOSIS — Z124 Encounter for screening for malignant neoplasm of cervix: Secondary | ICD-10-CM | POA: Diagnosis not present

## 2016-09-09 DIAGNOSIS — Z01419 Encounter for gynecological examination (general) (routine) without abnormal findings: Secondary | ICD-10-CM | POA: Diagnosis not present

## 2016-09-09 DIAGNOSIS — Z6828 Body mass index (BMI) 28.0-28.9, adult: Secondary | ICD-10-CM | POA: Diagnosis not present

## 2016-09-10 LAB — CYTOLOGY - PAP

## 2016-10-26 ENCOUNTER — Encounter: Payer: Self-pay | Admitting: Nurse Practitioner

## 2016-10-26 ENCOUNTER — Ambulatory Visit (INDEPENDENT_AMBULATORY_CARE_PROVIDER_SITE_OTHER): Payer: BLUE CROSS/BLUE SHIELD | Admitting: Nurse Practitioner

## 2016-10-26 VITALS — BP 103/74 | HR 77 | Temp 97.7°F | Ht 62.0 in | Wt 155.0 lb

## 2016-10-26 DIAGNOSIS — B029 Zoster without complications: Secondary | ICD-10-CM

## 2016-10-26 MED ORDER — VALACYCLOVIR HCL 500 MG PO TABS: ORAL_TABLET | ORAL | 0 refills | Status: DC

## 2016-10-26 MED ORDER — HYDROCODONE-ACETAMINOPHEN 5-325 MG PO TABS: 1.0000 | ORAL_TABLET | Freq: Four times a day (QID) | ORAL | 0 refills | Status: DC | PRN

## 2016-10-26 NOTE — Progress Notes (Signed)
   Subjective:    Patient ID: Erika Ray, female    DOB: 05/02/1961, 56 y.o.   MRN: [id number]  HPIPatient comes in today c/o rash on right side of "private " area. SHe said it started Friday and has spread. Very painful. Denies any new sexual partners.   Review of Systems  Constitutional: Negative.   HENT: Negative.   Respiratory: Negative.   Cardiovascular: Negative.   Gastrointestinal: Negative.   Genitourinary: Negative.   Skin: Positive for rash (private area).  Neurological: Negative.   Psychiatric/Behavioral: Negative.   All other systems reviewed and are negative.      Objective:   Physical Exam  Constitutional: She appears well-developed and well-nourished. She appears distressed (mild).  Cardiovascular: Normal rate and regular rhythm.   Pulmonary/Chest: Effort normal and breath sounds normal.  Skin: Skin is warm. Rash (vescular eruthemaotous patchy area on right labia majoria and right inner upper thigh.) noted.  Psychiatric: She has a normal mood and affect. Her behavior is normal. Judgment and thought content normal.   BP 103/74   Pulse 77   Temp 97.7 F (36.5 C) (Oral)   Ht 5\' 2"  (1.575 m)   Wt 155 lb (70.3 kg)   LMP 10/22/2014   BMI 28.35 kg/m        Assessment & Plan:  1. Herpes zoster without complication *cool compresses if help Avoid rubbing or scratching area Avoid elderly and people on chemo or immunocomprimised - valACYclovir (VALTREX) 500 MG tablet; 1 PO 3x a day X7 days  Dispense: 21 tablet; Refill: 0 - HYDROcodone-acetaminophen (LORTAB) 5-325 MG tablet; Take 1 tablet by mouth every 6 (six) hours as needed for moderate pain.  Dispense: 40 tablet; Refill: 0  Mary-Margaret Daphine DeutscherMartin, FNP

## 2016-10-26 NOTE — Patient Instructions (Signed)
Shingles Shingles is an infection that causes a painful skin rash and fluid-filled blisters. Shingles is caused by the same virus that causes chickenpox. Shingles only develops in people who:  Have had chickenpox.  Have gotten the chickenpox vaccine. (This is rare.) The first symptoms of shingles may be itching, tingling, or pain in an area on your skin. A rash will follow in a few days or weeks. The rash is usually on one side of the body in a bandlike or beltlike pattern. Over time, the rash turns into fluid-filled blisters that break open, scab over, and dry up. Medicines may:  Help you manage pain.  Help you recover more quickly.  Help to prevent long-term problems. Follow these instructions at home: Medicines  Take medicines only as told by your doctor.  Apply an anti-itch or numbing cream to the affected area as told by your doctor. Blister and Rash Care  Take a cool bath or put cool compresses on the area of the rash or blisters as told by your doctor. This may help with pain and itching.  Keep your rash covered with a loose bandage (dressing). Wear loose-fitting clothing.  Keep your rash and blisters clean with mild soap and cool water or as told by your doctor.  Check your rash every day for signs of infection. These include redness, swelling, and pain that lasts or gets worse.  Do not pick your blisters.  Do not scratch your rash. General instructions  Rest as told by your doctor.  Keep all follow-up visits as told by your doctor. This is important.  Until your blisters scab over, your infection can cause chickenpox in people who have never had it or been vaccinated against it. To prevent this from happening, avoid touching other people or being around other people, especially:  Babies.  Pregnant women.  Children who have eczema.  Elderly people who have transplants.  People who have chronic illnesses, such as leukemia or AIDS. Contact a doctor if:  Your  pain does not get better with medicine.  Your pain does not get better after the rash heals.  Your rash looks infected. Signs of infection include:  Redness.  Swelling.  Pain that lasts or gets worse. Get help right away if:  The rash is on your face or nose.  You have pain in your face, pain around your eye area, or loss of feeling on one side of your face.  You have ear pain or you have ringing in your ear.  You have loss of taste.  Your condition gets worse. This information is not intended to replace advice given to you by your health care provider. Make sure you discuss any questions you have with your health care provider. Document Released: 02/24/2008 Document Revised: 05/03/2016 Document Reviewed: 06/19/2014 Elsevier Interactive Patient Education  2017 Elsevier Inc.  

## 2016-10-27 ENCOUNTER — Telehealth: Payer: Self-pay | Admitting: Nurse Practitioner

## 2016-10-27 ENCOUNTER — Encounter: Payer: Self-pay | Admitting: Nurse Practitioner

## 2016-10-28 ENCOUNTER — Other Ambulatory Visit: Payer: Self-pay | Admitting: Nurse Practitioner

## 2016-10-28 MED ORDER — ACYCLOVIR 5 % EX CREA: 1.0000 "application " | TOPICAL_CREAM | CUTANEOUS | 0 refills | Status: DC

## 2016-10-28 NOTE — Telephone Encounter (Signed)
Patient aware and verbalizes understanding. 

## 2016-10-28 NOTE — Telephone Encounter (Signed)
thereis no cream for shingles- can do acyclovir butinsurance usually will not pay for it. I will send in rx will hav eto see how much it is when she oicks it up.

## 2016-10-29 NOTE — Telephone Encounter (Signed)
This patient called and rx for zovirax was sent into the pharmacy and pt is aware.

## 2016-10-30 ENCOUNTER — Encounter: Payer: Self-pay | Admitting: Physician Assistant

## 2016-10-30 ENCOUNTER — Ambulatory Visit (INDEPENDENT_AMBULATORY_CARE_PROVIDER_SITE_OTHER): Payer: BLUE CROSS/BLUE SHIELD | Admitting: Physician Assistant

## 2016-10-30 VITALS — BP 123/77 | HR 77 | Temp 96.7°F | Ht 62.0 in | Wt 155.0 lb

## 2016-10-30 DIAGNOSIS — B0229 Other postherpetic nervous system involvement: Secondary | ICD-10-CM | POA: Diagnosis not present

## 2016-10-30 DIAGNOSIS — B029 Zoster without complications: Secondary | ICD-10-CM

## 2016-10-30 MED ORDER — GABAPENTIN 300 MG PO CAPS: 300.0000 mg | ORAL_CAPSULE | Freq: Three times a day (TID) | ORAL | 2 refills | Status: DC

## 2016-10-30 MED ORDER — VALACYCLOVIR HCL 500 MG PO TABS: ORAL_TABLET | ORAL | 0 refills | Status: DC

## 2016-10-30 MED ORDER — SILVER SULFADIAZINE 1 % EX CREA: 1.0000 "application " | TOPICAL_CREAM | Freq: Two times a day (BID) | CUTANEOUS | 0 refills | Status: DC

## 2016-10-30 NOTE — Patient Instructions (Signed)
Postherpetic Neuralgia Postherpetic neuralgia (PHN) is nerve pain that occurs after a shingles infection. Shingles is a painful rash that appears on one side of the body, usually on your trunk or face. Shingles is caused by the varicella-zoster virus. This is the same virus that causes chickenpox. In people who have had chickenpox, the virus can resurface years later and cause shingles. You may have PHN if you continue to have pain for 3 months after your shingles rash has gone away. PHN appears in the same area where you had the shingles rash. For most people, PHN goes away within 1 year.  Getting a vaccination for shingles can prevent PHN. This vaccine is recommended for people older than 50. It may prevent shingles and may also lower your risk of PHN if you do get shingles. CAUSES PHN is caused by damage to your nerves from the varicella-zoster virus. This damage makes your nerves overly sensitive.  RISK FACTORS Aging is the biggest risk factor for developing PHN. Most people who get PHN are older than 60. Other risk factors include:  Having very bad pain before your shingles rash starts.  Having a very bad rash.  Having shingles in the nerve that supplies your face and eye (trigeminal nerve). SIGNS AND SYMPTOMS Pain is the main symptom of PHN. The pain is often very bad and may be described as stabbing, burning, or feeling like an electric shock. The pain may come and go or may be there all the time. Pain may be triggered by light touches on the skin or changes in temperature. You may have itching along with the pain. DIAGNOSIS  Your health care provider may diagnose PHN based on your symptoms and your history of shingles. Lab studies and other diagnostic tests are usually not needed. TREATMENT  There is no cure for PHN. Treatment for PHN will focus on pain relief. Over-the-counter pain relievers do not usually relieve PHN pain. You may need to work with a pain specialist. Treatment may  include:  Antidepressant medicines to help with pain and improve sleep.  Antiseizure medicines to relieve nerve pain.  Strong pain relievers (opioids).  A numbing patch worn on the skin (lidocaine patch). HOME CARE INSTRUCTIONS It may take a long time to recover from PHN. Work closely with your health care provider, and have a good support system at home.   Take all medicines as directed by your health care provider.  Wear loose, comfortable clothing.  Cover sensitive areas with a dressing to reduce friction from clothing rubbing on the area.  If cold does not make your pain worse, try applying a cool compress or cooling gel pack to the area.  Talk to your health care provider if you feel depressed or desperate. Living with long-term pain can be depressing. SEEK MEDICAL CARE IF:  Your medicine is not helping.  You are struggling to manage your pain at home. This information is not intended to replace advice given to you by your health care provider. Make sure you discuss any questions you have with your health care provider. Document Released: 11/28/2002 Document Revised: 09/28/2014 Document Reviewed: 08/29/2013 Elsevier Interactive Patient Education  2017 Elsevier Inc.  

## 2016-10-30 NOTE — Progress Notes (Signed)
BP 123/77   Pulse 77   Temp (!) 96.7 F (35.9 C) (Oral)   Ht 5\' 2"  (1.575 m)   Wt 155 lb (70.3 kg)   LMP 10/22/2014   BMI 28.35 kg/m    Subjective:    Patient ID: Erika Ray, female    DOB: 12/03/1960, 56 y.o.   MRN: [id number]  HPI: Erika Ray is a 56 y.o. female presenting on 10/30/2016 for Recheck shingles  This patient comes in for  recheck on medications and shingles. All medications are reviewed today. There are no reports of any problems with the medications. All of the medical conditions are reviewed and updated.  Rash is drying but still very painful, has not been able to wear clothes. Pain has not been reducing also.  Relevant past medical, surgical, family and social history reviewed and updated as indicated. Allergies and medications reviewed and updated.  Past Medical History:  Diagnosis Date  . Broken arm 08/1966   left  . Rectal bleeding   . Rectal pain   . Thrombosed hemorrhoids     Past Surgical History:  Procedure Laterality Date  . FRACTURE SURGERY  1967   left arm  . HEMORROIDECTOMY  06/11/11   thromb hems    Review of Systems  Constitutional: Negative.   HENT: Negative.   Eyes: Negative.   Respiratory: Negative.   Gastrointestinal: Negative.   Genitourinary: Negative.   Skin: Positive for color change, rash and wound.  Neurological: Negative for weakness.    Allergies as of 10/30/2016      Reactions   Ampicillin Rash   Penicillins Hives   All over the body, except the face.      Medication List       Accurate as of 10/30/16  3:57 PM. Always use your most recent med list.          acyclovir cream 5 % Commonly known as:  ZOVIRAX Apply 1 application topically every 3 (three) hours.   cholecalciferol 1000 units tablet Commonly known as:  VITAMIN D Take 1,000 Units by mouth. 1 tablet every 5 days per GYN   cyclobenzaprine 10 MG tablet Commonly known as:  FLEXERIL Take 1 tablet (10 mg total) by mouth at bedtime.   gabapentin  300 MG capsule Commonly known as:  NEURONTIN Take 1-2 capsules (300-600 mg total) by mouth 3 (three) times daily.   HYDROcodone-acetaminophen 5-325 MG tablet Commonly known as:  LORTAB Take 1 tablet by mouth every 6 (six) hours as needed for moderate pain.   silver sulfADIAZINE 1 % cream Commonly known as:  SILVADENE Apply 1 application topically 2 (two) times daily.   valACYclovir 500 MG tablet Commonly known as:  VALTREX 1 PO 3x a day X7 days          Objective:    BP 123/77   Pulse 77   Temp (!) 96.7 F (35.9 C) (Oral)   Ht 5\' 2"  (1.575 m)   Wt 155 lb (70.3 kg)   LMP 10/22/2014   BMI 28.35 kg/m   Allergies  Allergen Reactions  . Ampicillin Rash  . Penicillins Hives    All over the body, except the face.    Physical Exam  Constitutional: She is oriented to person, place, and time. She appears well-developed and well-nourished.  HENT:  Head: Normocephalic and atraumatic.  Eyes: Conjunctivae and EOM are normal. Pupils are equal, round, and reactive to light.  Cardiovascular: Normal rate, regular rhythm, normal heart sounds and intact  distal pulses.   Pulmonary/Chest: Effort normal and breath sounds normal.  Abdominal: Soft. Bowel sounds are normal.  Neurological: She is alert and oriented to person, place, and time. She has normal reflexes.  Skin: Skin is warm and dry. Lesion and rash noted. Rash is pustular. There is erythema. No pallor.     Psychiatric: She has a normal mood and affect. Her behavior is normal. Judgment and thought content normal.        Assessment & Plan:   1. Herpes zoster without complication OUT 2/5-2/19/18 due to rash, infectiousness, pain - valACYclovir (VALTREX) 500 MG tablet; 1 PO 3x a day X7 days  Dispense: 21 tablet; Refill: 0 - silver sulfADIAZINE (SILVADENE) 1 % cream; Apply 1 application topically 2 (two) times daily.  Dispense: 50 g; Refill: 0 - gabapentin (NEURONTIN) 300 MG capsule; Take 1-2 capsules (300-600 mg total) by  mouth 3 (three) times daily.  Dispense: 60 capsule; Refill: 2  2. Postherpetic neuralgia - gabapentin (NEURONTIN) 300 MG capsule; Take 1-2 capsules (300-600 mg total) by mouth 3 (three) times daily.  Dispense: 60 capsule; Refill: 2    Continue all other maintenance medications as listed above.  Follow up plan: No Follow-up on file.  Educational handout given for postherpetic neuralgia  Remus Loffler PA-C [facility] [address]  [place], [place] [address] [phone]   10/30/2016, 3:57 PM

## 2016-12-12 ENCOUNTER — Ambulatory Visit (INDEPENDENT_AMBULATORY_CARE_PROVIDER_SITE_OTHER): Payer: BLUE CROSS/BLUE SHIELD | Admitting: Family Medicine

## 2016-12-12 ENCOUNTER — Encounter: Payer: Self-pay | Admitting: Family Medicine

## 2016-12-12 VITALS — BP 109/69 | HR 73 | Temp 98.2°F | Ht 62.0 in | Wt 158.0 lb

## 2016-12-12 DIAGNOSIS — M7752 Other enthesopathy of left foot: Secondary | ICD-10-CM | POA: Diagnosis not present

## 2016-12-12 MED ORDER — IBUPROFEN 800 MG PO TABS: 800.0000 mg | ORAL_TABLET | Freq: Three times a day (TID) | ORAL | 0 refills | Status: DC | PRN

## 2016-12-12 NOTE — Progress Notes (Signed)
BP 109/69   Pulse 73   Temp 98.2 F (36.8 C) (Oral)   Ht 5\' 2"  (1.575 m)   Wt 158 lb (71.7 kg)   LMP 10/22/2014   BMI 28.90 kg/m    Subjective:    Patient ID: Erika ReichmannDebra Ray, female    DOB: 07/15/1961, 56 y.o.   MRN: [id number]  HPI: Erika ReichmannDebra Ray is a 56 y.o. female presenting on 12/12/2016 for Ankle Pain (left )   HPI Left ankle pain Patient is coming in with left ankle pain that has been going on since about a week ago. 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 She denies any redness or warmth or fevers or chills. She did say it was more swollen yesterday and the day before but she took some ibuprofen yesterday and it did help some. She is also been trying to do ice and elevation which is been helping some. She is able to ambulate on it but it has started to cause her to have a little bit of a limp.  Relevant past medical, surgical, family and social history reviewed and updated as indicated. Interim medical history since our last visit reviewed. Allergies and medications reviewed and updated.  Review of Systems  Constitutional: Negative for chills and fever.  Respiratory: Negative for chest tightness and shortness of breath.   Cardiovascular: Positive for leg swelling. Negative for chest pain.  Musculoskeletal: Positive for arthralgias and joint swelling. Negative for back pain and gait problem.  Skin: Negative for color change and rash.  Neurological: Negative for light-headedness and headaches.  Psychiatric/Behavioral: Negative for agitation and behavioral problems.  All other systems reviewed and are negative.   Per HPI unless specifically indicated above     Objective:    BP 109/69   Pulse 73   Temp 98.2 F (36.8 C) (Oral)   Ht 5\' 2"  (1.575 m)   Wt 158 lb (71.7 kg)   LMP 10/22/2014   BMI 28.90 kg/m   Wt Readings from Last 3 Encounters:    12/12/16 158 lb (71.7 kg)  10/30/16 155 lb (70.3 kg)  10/26/16 155 lb (70.3 kg)    Physical Exam  Constitutional: She is oriented to person, place, and time. She appears well-developed and well-nourished. No distress.  Eyes: Conjunctivae are normal.  Musculoskeletal: Normal range of motion. She exhibits tenderness. She exhibits no edema.       Left foot: There is tenderness (Tenderness over the lateral and posterior aspect of her ankle just inferior and posterior to the lateral malleolus). There is normal range of motion, no bony tenderness, no swelling (No swelling on exam today.), normal capillary refill and no deformity.  Neurological: She is alert and oriented to person, place, and time. Coordination normal.  Skin: Skin is warm and dry. No rash noted. She is not diaphoretic.  Psychiatric: She has a normal mood and affect. Her behavior is normal.  Nursing note and vitals reviewed.     Assessment & Plan:   Problem List Items Addressed This Visit    None    Visit Diagnoses    Left ankle tendinitis    -  Primary   Relevant Medications   ibuprofen (ADVIL,MOTRIN) 800 MG tablet       Follow up plan: Return if symptoms worsen or fail to improve.  Counseling provided for all of the vaccine components  No orders of the defined types were placed in this encounter.   Arville Care, MD [facility] 12/12/2016, 8:43 AM

## 2016-12-16 ENCOUNTER — Ambulatory Visit (INDEPENDENT_AMBULATORY_CARE_PROVIDER_SITE_OTHER): Payer: BLUE CROSS/BLUE SHIELD | Admitting: Family Medicine

## 2016-12-16 ENCOUNTER — Encounter: Payer: Self-pay | Admitting: Family Medicine

## 2016-12-16 VITALS — BP 98/63 | HR 64 | Temp 97.7°F | Ht 62.0 in | Wt 160.0 lb

## 2016-12-16 DIAGNOSIS — M7752 Other enthesopathy of left foot: Secondary | ICD-10-CM | POA: Diagnosis not present

## 2016-12-16 MED ORDER — PREDNISONE 10 MG PO TABS: ORAL_TABLET | ORAL | 0 refills | Status: DC

## 2016-12-16 NOTE — Progress Notes (Signed)
Subjective:  Patient ID: Erika Ray, female    DOB: 1961-04-15  Age: 56 y.o. MRN: [id number]  CC: Joint Swelling (pt here today c/o left ankle swelling and hurting. Ibuprofen 800 not helping.)   HPI Erika Ray presents for Pain and swelling at the posterior left ankle. This started couple of weeks ago. It's been building slowly during that time. She notes that she has to wear steel toed boots at work that are fairly heavy. She got some new boots and the pain started when she started wearing those. Lately she has been alternating using the new boots and going back to her old boots. This seems to have helped some temporarily. She says that the ankle is sore and feels bruised and has begun swelling. She points posteriorly at the Achilles tendon region near its insertion  History Tykeisha has a past medical history of Broken arm (08/1966); Rectal bleeding; Rectal pain; and Thrombosed hemorrhoids.   She has a past surgical history that includes Fracture surgery (4098) and Hemorroidectomy (06/11/11).   Her family history includes COPD in her father; Cancer in her maternal grandmother and mother; Heart disease (age of onset: 84) in her father.She reports that she has never smoked. She has never used smokeless tobacco. She reports that she does not drink alcohol or use drugs.  Current Outpatient Prescriptions on File Prior to Visit  Medication Sig Dispense Refill  . cholecalciferol (VITAMIN D) 1000 UNITS tablet Take 1,000 Units by mouth. 1 tablet every 5 days per GYN    . cyclobenzaprine (FLEXERIL) 10 MG tablet Take 1 tablet (10 mg total) by mouth at bedtime. 12 tablet 0  . gabapentin (NEURONTIN) 300 MG capsule Take 1-2 capsules (300-600 mg total) by mouth 3 (three) times daily. 60 capsule 2  . ibuprofen (ADVIL,MOTRIN) 800 MG tablet Take 1 tablet (800 mg total) by mouth every 8 (eight) hours as needed. 30 tablet 0  . valACYclovir (VALTREX) 500 MG tablet 1 PO 3x a day X7 days 21 tablet 0   No  current facility-administered medications on file prior to visit.     ROS Review of Systems  Constitutional: Negative for fever.  HENT: Negative for congestion, rhinorrhea and sore throat.   Respiratory: Negative for cough and shortness of breath.   Cardiovascular: Negative for chest pain and palpitations.  Gastrointestinal: Negative for abdominal pain.  Musculoskeletal: Positive for arthralgias, joint swelling and myalgias.       See history of present illness    Objective:  BP 98/63   Pulse 64   Temp 97.7 F (36.5 C) (Oral)   Ht 5\' 2"  (1.575 m)   Wt 160 lb (72.6 kg)   LMP 10/22/2014   BMI 29.26 kg/m   Physical Exam  Constitutional: She appears well-developed and well-nourished.  HENT:  Head: Normocephalic.  Cardiovascular: Normal rate and regular rhythm.   No murmur heard. Pulmonary/Chest: Effort normal and breath sounds normal.  Musculoskeletal: She exhibits tenderness (over achilles tendon). She exhibits no edema.    Assessment & Plan:   [place]ba was seen today for joint swelling.  Diagnoses and all orders for this visit:  Left ankle tendinitis  Other orders -     predniSONE (DELTASONE) 10 MG tablet; Take 5 daily for 3 days followed by 4,3,2 and 1 for 3 days each.   I am having Ms. Cala start on predniSONE. I am also having her maintain her cholecalciferol, cyclobenzaprine, valACYclovir, gabapentin, and ibuprofen.  Meds ordered this encounter  Medications  .  predniSONE (DELTASONE) 10 MG tablet    Sig: Take 5 daily for 3 days followed by 4,3,2 and 1 for 3 days each.    Dispense:  45 tablet    Refill:  0   ASO Ankle brace dispensed  Follow-up: No Follow-up on file.  Mechele ClaudeWarren Reginald Mangels, M.D.

## 2017-01-05 DIAGNOSIS — M25572 Pain in left ankle and joints of left foot: Secondary | ICD-10-CM | POA: Diagnosis not present

## 2017-01-05 DIAGNOSIS — M84364A Stress fracture, left fibula, initial encounter for fracture: Secondary | ICD-10-CM | POA: Diagnosis not present

## 2017-01-06 ENCOUNTER — Telehealth: Payer: Self-pay | Admitting: Family Medicine

## 2017-01-26 DIAGNOSIS — M84364A Stress fracture, left fibula, initial encounter for fracture: Secondary | ICD-10-CM | POA: Diagnosis not present

## 2017-01-26 DIAGNOSIS — M25572 Pain in left ankle and joints of left foot: Secondary | ICD-10-CM | POA: Diagnosis not present

## 2017-01-27 ENCOUNTER — Ambulatory Visit (INDEPENDENT_AMBULATORY_CARE_PROVIDER_SITE_OTHER): Payer: BLUE CROSS/BLUE SHIELD | Admitting: Physician Assistant

## 2017-01-27 ENCOUNTER — Encounter: Payer: Self-pay | Admitting: Physician Assistant

## 2017-01-27 DIAGNOSIS — B0229 Other postherpetic nervous system involvement: Secondary | ICD-10-CM | POA: Insufficient documentation

## 2017-01-27 DIAGNOSIS — B029 Zoster without complications: Secondary | ICD-10-CM | POA: Insufficient documentation

## 2017-01-27 MED ORDER — GABAPENTIN 300 MG PO CAPS: 300.0000 mg | ORAL_CAPSULE | Freq: Three times a day (TID) | ORAL | 2 refills | Status: DC

## 2017-01-27 NOTE — Patient Instructions (Addendum)
[facility]   In a few days you may receive a survey in the mail or online from [employer] regarding your visit with us today. Please take a moment to fill this out. Your feedback is very important to our whole office. It can help us better understand your needs as well as improve your experience and satisfaction. Thank you for taking your time to complete it. We care about you.  Prudy FeelerAngel Nimisha Rathel, PA-C

## 2017-01-28 NOTE — Progress Notes (Signed)
BP 92/63   Pulse 72   Temp 99.3 F (37.4 C) (Oral)   Ht 5\' 2"  (1.575 m)   Wt 160 lb (72.6 kg)   LMP 10/22/2014   BMI 29.26 kg/m    Subjective:    Patient ID: Erika ReichmannDebra Buczkowski, female    DOB: 06/02/1961, 56 y.o.   MRN: [id number]  HPI: Erika ReichmannDebra Hoogland is a 56 y.o. female presenting on 01/27/2017 for Follow-up (pt here today following up after being dx'd with shingles and rx'd Gabapentin)  This patient comes in for periodic recheck on medications and conditions including neuropathy and joint pain from stress fracture. She reports that the gabapentin is helping her pain a lot and would like to stay on it for now..   All medications are reviewed today. There are no reports of any problems with the medications. All of the medical conditions are reviewed and updated.  Lab work is reviewed and will be ordered as medically necessary. There are no new problems reported with today's visit.   Relevant past medical, surgical, family and social history reviewed and updated as indicated. Allergies and medications reviewed and updated.  Past Medical History:  Diagnosis Date  . Broken arm 08/1966   left  . Rectal bleeding   . Rectal pain   . Thrombosed hemorrhoids     Past Surgical History:  Procedure Laterality Date  . FRACTURE SURGERY  1967   left arm  . HEMORROIDECTOMY  06/11/11   thromb hems    Review of Systems  Constitutional: Negative.   HENT: Negative.   Eyes: Negative.   Respiratory: Negative.   Gastrointestinal: Negative.   Genitourinary: Negative.     Allergies as of 01/27/2017      Reactions   Ampicillin Rash   Penicillins Hives   All over the body, except the face.      Medication List       Accurate as of 01/27/17 11:59 PM. Always use your most recent med list.          cholecalciferol 1000 units tablet Commonly known as:  VITAMIN D Take 1,000 Units by mouth. 1 tablet every 5 days per GYN   cyclobenzaprine 10 MG tablet Commonly known as:  FLEXERIL Take 1  tablet (10 mg total) by mouth at bedtime.   gabapentin 300 MG capsule Commonly known as:  NEURONTIN Take 1-2 capsules (300-600 mg total) by mouth 3 (three) times daily.   valACYclovir 500 MG tablet Commonly known as:  VALTREX 1 PO 3x a day X7 days          Objective:    BP 92/63   Pulse 72   Temp 99.3 F (37.4 C) (Oral)   Ht 5\' 2"  (1.575 m)   Wt 160 lb (72.6 kg)   LMP 10/22/2014   BMI 29.26 kg/m   Allergies  Allergen Reactions  . Ampicillin Rash  . Penicillins Hives    All over the body, except the face.    Physical Exam  Constitutional: She is oriented to person, place, and time. She appears well-developed and well-nourished.  HENT:  Head: Normocephalic and atraumatic.  Eyes: Conjunctivae and EOM are normal. Pupils are equal, round, and reactive to light.  Cardiovascular: Normal rate, regular rhythm, normal heart sounds and intact distal pulses.   Pulmonary/Chest: Effort normal and breath sounds normal.  Abdominal: Soft. Bowel sounds are normal.  Neurological: She is alert and oriented to person, place, and time. She has normal reflexes.  Skin: Skin is  warm and dry. No rash noted.  Psychiatric: She has a normal mood and affect. Her behavior is normal. Judgment and thought content normal.    Results for orders placed or performed in visit on 09/09/16  Cytology - PAP  Result Value Ref Range   CYTOLOGY - PAP PAP RESULT       Assessment & Plan:   1. Herpes zoster without complication - gabapentin (NEURONTIN) 300 MG capsule; Take 1-2 capsules (300-600 mg total) by mouth 3 (three) times daily.  Dispense: 180 capsule; Refill: 2  2. Postherpetic neuralgia - gabapentin (NEURONTIN) 300 MG capsule; Take 1-2 capsules (300-600 mg total) by mouth 3 (three) times daily.  Dispense: 180 capsule; Refill: 2   Current Outpatient Prescriptions:  .  cholecalciferol (VITAMIN D) 1000 UNITS tablet, Take 1,000 Units by mouth. 1 tablet every 5 days per GYN, Disp: , Rfl:  .   cyclobenzaprine (FLEXERIL) 10 MG tablet, Take 1 tablet (10 mg total) by mouth at bedtime., Disp: 12 tablet, Rfl: 0 .  gabapentin (NEURONTIN) 300 MG capsule, Take 1-2 capsules (300-600 mg total) by mouth 3 (three) times daily., Disp: 180 capsule, Rfl: 2 .  valACYclovir (VALTREX) 500 MG tablet, 1 PO 3x a day X7 days, Disp: 21 tablet, Rfl: 0  Continue all other maintenance medications as listed above.  Follow up plan: Return if symptoms worsen or fail to improve.  Educational handout given for neuralgia  Remus Loffler PA-C [facility] [address]  [place], [place] [address] [phone]   01/28/2017, 10:47 PM

## 2017-02-04 ENCOUNTER — Ambulatory Visit (INDEPENDENT_AMBULATORY_CARE_PROVIDER_SITE_OTHER): Payer: BLUE CROSS/BLUE SHIELD | Admitting: *Deleted

## 2017-02-04 DIAGNOSIS — Z23 Encounter for immunization: Secondary | ICD-10-CM

## 2017-02-04 NOTE — Progress Notes (Signed)
Pt given Shingrix vaccine Tolerated well 

## 2017-02-16 DIAGNOSIS — M84364D Stress fracture, left fibula, subsequent encounter for fracture with routine healing: Secondary | ICD-10-CM | POA: Diagnosis not present

## 2017-02-16 DIAGNOSIS — M25572 Pain in left ankle and joints of left foot: Secondary | ICD-10-CM | POA: Diagnosis not present

## 2017-03-10 ENCOUNTER — Encounter: Payer: Self-pay | Admitting: Family Medicine

## 2017-03-10 ENCOUNTER — Ambulatory Visit (INDEPENDENT_AMBULATORY_CARE_PROVIDER_SITE_OTHER): Payer: BLUE CROSS/BLUE SHIELD | Admitting: Family Medicine

## 2017-03-10 VITALS — BP 107/74 | HR 66 | Temp 97.8°F | Ht 62.0 in | Wt 160.0 lb

## 2017-03-10 DIAGNOSIS — M25572 Pain in left ankle and joints of left foot: Secondary | ICD-10-CM | POA: Diagnosis not present

## 2017-03-10 DIAGNOSIS — R233 Spontaneous ecchymoses: Secondary | ICD-10-CM

## 2017-03-10 MED ORDER — PREDNISONE 20 MG PO TABS: ORAL_TABLET | ORAL | 0 refills | Status: DC

## 2017-03-10 NOTE — Progress Notes (Signed)
LMP 10/22/2014    Subjective:    Patient ID: Erika Ray, female    DOB: 12/19/1960, 56 y.o.   MRN: [id number]  HPI: Erika Ray is a 56 y.o. female presenting on 03/10/2017 for No chief complaint on file.   HPI Left ankle pain and swelling Patient has been having left ankle pain and swelling, she was diagnosed with a stress fracture by podiatry about 5 weeks ago and wore her boot for about 3-1/2 weeks and has been back at work over the past couple weeks. She said initially was going well at work but over the past 2 days she has been getting increased swelling and pain especially on the medial aspect of her left ankle. She denies any fevers or chills or redness or warmth. He said that it was a tibial stress fracture when she saw the podiatrist. She can ambulate but it is painful to ambulate.  Read rash on shins Patient comes in today complaining of a red rash on her shins is been there for the past couple days. She cannot recall any specific trauma or exposures or anything that changed and the rash is just on both anterior shins. It is red spotty rash. She denies any fevers or chills or redness or warmth or drainage.  Relevant past medical, surgical, family and social history reviewed and updated as indicated. Interim medical history since our last visit reviewed. Allergies and medications reviewed and updated.  Review of Systems  Constitutional: Negative for chills and fever.  HENT: Negative for congestion, ear discharge and ear pain.   Respiratory: Negative for cough, chest tightness and shortness of breath.   Cardiovascular: Negative for chest pain and leg swelling.  Genitourinary: Negative for difficulty urinating and dysuria.  Musculoskeletal: Positive for arthralgias and joint swelling. Negative for back pain.  Skin: Positive for rash.  Neurological: Negative for light-headedness and headaches.  Psychiatric/Behavioral: Negative for agitation and behavioral problems.  All other  systems reviewed and are negative.   Per HPI unless specifically indicated above        Objective:    LMP 10/22/2014   Wt Readings from Last 3 Encounters:  01/27/17 160 lb (72.6 kg)  12/16/16 160 lb (72.6 kg)  12/12/16 158 lb (71.7 kg)    Physical Exam  Constitutional: She is oriented to person, place, and time. She appears well-developed and well-nourished. No distress.  Eyes: Conjunctivae are normal.  Musculoskeletal: Normal range of motion. She exhibits no edema.       Left ankle: She exhibits swelling. She exhibits normal range of motion, no ecchymosis, no deformity and normal pulse. Tenderness. Medial malleolus tenderness found. Achilles tendon exhibits no pain and no defect.  Neurological: She is alert and oriented to person, place, and time. Coordination normal.  Skin: Skin is warm and dry. Purpura (Petechiae and small purpura on anterior shins about 5 cm in diameter area on left shin and 2 cm in diameter area on right shin. Blanching and non-raised and no tenderness) and rash noted. She is not diaphoretic.  Psychiatric: She has a normal mood and affect. Her behavior is normal.  Nursing note and vitals reviewed.     Assessment & Plan:   Problem List Items Addressed This Visit    None    Visit Diagnoses    Petechial rash    -  Primary   Bilateral shin, will do CBC and CRP   Relevant Medications   predniSONE (DELTASONE) 20 MG tablet   Other Relevant Orders  CBC with Differential/Platelet   Sedimentation rate   High sensitivity CRP   Acute left ankle pain       Was diagnosed with a stress fracture by podiatry, recommended for her to go back because she didn't the boot and now she is having continued pain   Relevant Medications   predniSONE (DELTASONE) 20 MG tablet       Follow up plan: Return if symptoms worsen or fail to improve.  Counseling provided for all of the vaccine components Orders Placed This Encounter  Procedures  . CBC with  Differential/Platelet  . Sedimentation rate  . High sensitivity CRP    Arville Care, MD [facility] 03/10/2017, 10:01 AM

## 2017-03-11 ENCOUNTER — Telehealth: Payer: Self-pay | Admitting: Family Medicine

## 2017-03-11 DIAGNOSIS — M84364D Stress fracture, left fibula, subsequent encounter for fracture with routine healing: Secondary | ICD-10-CM | POA: Diagnosis not present

## 2017-03-11 DIAGNOSIS — M25572 Pain in left ankle and joints of left foot: Secondary | ICD-10-CM | POA: Diagnosis not present

## 2017-03-11 LAB — CBC WITH DIFFERENTIAL/PLATELET
BASOS: 0 %
Basophils Absolute: 0 10*3/uL (ref 0.0–0.2)
EOS (ABSOLUTE): 0.2 10*3/uL (ref 0.0–0.4)
EOS: 3 %
HEMATOCRIT: 38.6 % (ref 34.0–46.6)
HEMOGLOBIN: 12.9 g/dL (ref 11.1–15.9)
Immature Grans (Abs): 0 10*3/uL (ref 0.0–0.1)
Immature Granulocytes: 0 %
LYMPHS ABS: 2 10*3/uL (ref 0.7–3.1)
Lymphs: 27 %
MCH: 30.4 pg (ref 26.6–33.0)
MCHC: 33.4 g/dL (ref 31.5–35.7)
MCV: 91 fL (ref 79–97)
MONOCYTES: 9 %
Monocytes Absolute: 0.7 10*3/uL (ref 0.1–0.9)
NEUTROS ABS: 4.5 10*3/uL (ref 1.4–7.0)
Neutrophils: 61 %
Platelets: 228 10*3/uL (ref 150–379)
RBC: 4.24 x10E6/uL (ref 3.77–5.28)
RDW: 13.3 % (ref 12.3–15.4)
WBC: 7.5 10*3/uL (ref 3.4–10.8)

## 2017-03-11 LAB — SEDIMENTATION RATE: Sed Rate: 2 mm/hr (ref 0–40)

## 2017-03-11 LAB — HIGH SENSITIVITY CRP: CRP HIGH SENSITIVITY: 10.7 mg/L — AB (ref 0.00–3.00)

## 2017-03-15 ENCOUNTER — Other Ambulatory Visit: Payer: Self-pay | Admitting: Podiatry

## 2017-03-15 DIAGNOSIS — M65872 Other synovitis and tenosynovitis, left ankle and foot: Secondary | ICD-10-CM | POA: Diagnosis not present

## 2017-03-15 DIAGNOSIS — M25572 Pain in left ankle and joints of left foot: Secondary | ICD-10-CM

## 2017-03-15 DIAGNOSIS — M84362A Stress fracture, left tibia, initial encounter for fracture: Secondary | ICD-10-CM | POA: Diagnosis not present

## 2017-03-15 DIAGNOSIS — M84364D Stress fracture, left fibula, subsequent encounter for fracture with routine healing: Secondary | ICD-10-CM | POA: Diagnosis not present

## 2017-03-15 DIAGNOSIS — R609 Edema, unspecified: Secondary | ICD-10-CM

## 2017-03-15 DIAGNOSIS — M84361A Stress fracture, right tibia, initial encounter for fracture: Secondary | ICD-10-CM | POA: Diagnosis not present

## 2017-03-16 ENCOUNTER — Telehealth: Payer: Self-pay | Admitting: Physician Assistant

## 2017-03-16 NOTE — Telephone Encounter (Signed)
Very good foot orthopedist, she should go there

## 2017-03-17 NOTE — Telephone Encounter (Signed)
Pt aware.

## 2017-03-23 DIAGNOSIS — M84362A Stress fracture, left tibia, initial encounter for fracture: Secondary | ICD-10-CM | POA: Diagnosis not present

## 2017-03-25 ENCOUNTER — Other Ambulatory Visit: Payer: Self-pay | Admitting: Physician Assistant

## 2017-03-25 DIAGNOSIS — M8430XA Stress fracture, unspecified site, initial encounter for fracture: Secondary | ICD-10-CM

## 2017-03-29 ENCOUNTER — Ambulatory Visit (INDEPENDENT_AMBULATORY_CARE_PROVIDER_SITE_OTHER): Payer: BLUE CROSS/BLUE SHIELD

## 2017-03-29 DIAGNOSIS — M8430XA Stress fracture, unspecified site, initial encounter for fracture: Secondary | ICD-10-CM

## 2017-03-29 DIAGNOSIS — Z780 Asymptomatic menopausal state: Secondary | ICD-10-CM | POA: Diagnosis not present

## 2017-03-30 ENCOUNTER — Encounter: Payer: Self-pay | Admitting: Pharmacist

## 2017-03-30 ENCOUNTER — Ambulatory Visit (INDEPENDENT_AMBULATORY_CARE_PROVIDER_SITE_OTHER): Payer: BLUE CROSS/BLUE SHIELD | Admitting: Pharmacist

## 2017-03-30 DIAGNOSIS — M80072G Age-related osteoporosis with current pathological fracture, left ankle and foot, subsequent encounter for fracture with delayed healing: Secondary | ICD-10-CM

## 2017-03-30 DIAGNOSIS — M80872G Other osteoporosis with current pathological fracture, left ankle and foot, subsequent encounter for fracture with delayed healing: Secondary | ICD-10-CM

## 2017-03-30 HISTORY — DX: Age-related osteoporosis with current pathological fracture, left ankle and foot, subsequent encounter for fracture with delayed healing: M80.072G

## 2017-03-30 NOTE — Progress Notes (Signed)
Patient ID: Jenny ReichmannDebra Dealmeida, female   DOB: 08/19/1961, 56 y.o.   MRN: [id number]    HPI: Patient is here to discuss result of BMD today.  DEXA was checked yesterday.   Prior fracture?  Yes - 2 stress fracture in left ankle this year.  Currently wearing boot - seeing orthopedist.  Next visit is August 2018.  Back Pain?  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        Kyphosis?  No Med(s) for Osteoporosis/Osteopenia:  none Med(s) previously tried for Osteoporosis/Osteopenia:  none                                                             PMH: Age at menopause:  3 years ago - age 56 yo Hysterectomy?  No Oophorectomy?  No HRT? No Steroid Use?  No Thyroid med?  No History of cancer?  No History of digestive disorders (ie Crohn's)?  No Current or previous eating disorders?  No Last Vitamin D Result:  55 (06/26/2013) Last GFR Result:  104 (03/26/2016)   FH/SH: Family history of osteoporosis?  No Parent with history of hip fracture?  No Family history of breast cancer?  No Exercise?  No - limited by ortho condition Smoking?  No Alcohol?  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  Discussed BMD  / DEXA results and discussed fracture risk. 2.   Discussed treatment options - patient is considering either oral bisphosphonate or 3 months of Forteo / Tymlos followed by  oral bisphosphonate.  Patient wished that her orthopedist Dr Victorino DikeHewitt weigh in on treatment decision.  Left message for Dr Victorino DikeHewitt and also faxed copies of DEXA to Dr Victorino DikeHewitt and Dr Ulice Brilliantrake  3.   recommend calcium 1200mg  daily through supplementation or diet.  4.  Counseled and educated about fall risk and prevention. 5.  I discussed weight bearing exercise but I did not recommend starting until stress fracture healed and cleared by orthopedist to begin exericise  Recheck DEXA:  6 to 12 months is starts Forteo or Tymlos; 2 years otherwise  Time spent counseling patient:  45 minutes

## 2017-03-30 NOTE — Patient Instructions (Signed)
Calcium & Vitamin D: The Facts  Why is calcium and vitamin D consumption important? Calcium: . Most Americans do not consume adequate amounts of calcium! Calcium is required for proper muscle function, nerve communication, bone support, and many other functions in the body.  . The body uses bones as a source of calcium. Bones 'remodel' themselves continuously - the body constantly breaks bone down to release calcium and rebuilds bones by replacing calcium in the bone later.  . As we get older, the rate of bone breakdown occurs faster than bone rebuilding which could lead to osteopenia, osteoporosis, and possible fractures.   Vitamin D: . People naturally make vitamin D in the body when sunlight hits the skin and triggers a process that leads to vitamin D production. This natural vitamin D production requires about 10-15 minutes of sun exposure on the hands, arms, and face at least 2-3 times per week. However, due to decreased sun exposure and the use of sunscreen, most people will need to get additional vitamin D from foods or supplements. Your doctor can measure your body's vitamin D level through a simple blood test to determine your daily vitamin D needs.  . Vitamin D is used to help the body absorb calcium, maintain bone health, help the immune system, and reduce inflammation. It also plays a role in muscle performance, balance and risk of falling.  . Vitamin D deficiency can lead to osteomalacia or softening of the bones, bone pain, and muscle weakness.   The recommended daily allowance of Calcium and Vitamin D varies for different age groups. Age group Calcium (mg) Vitamin D (IU)  Females and Males: Age 33-50 1000 mg 600 IU  Females: Age 62- 54 1200 mg 600 IU  Males: Age 11-70 1000 mg 600 IU  Females and Males: Age 89+ 1200 mg 800 IU  Pregnant/lactating Females age 63-50 1000 mg 600 IU   How much Calcium do you get in your diet? Calcium Intake # of servings per day  Total calcium (mg)   Skim milk, 2% milk (1 cup) _________ x 300 mg   Yogurt (1 small container) _________ x 200 mg   Cheese (1oz) _________ x 200 mg   Cottage Cheese (1 cup)      ________ x 150 mg   Almond milk (1 cup) _________ x 450 mg   Fortified Orange Juice (1 cup) _________ x 300 mg   Broccoli or spinach ( 1 cup) _________ x 100 mg   Salmon (3 oz) _________ x 150 mg    Almonds (1/4 cup) _______ x 90 mg    Recommend considering calcium supplement that will provide 52m of calcium and take twice a day  Continue vitamin D 2000 IU daily  How do we get Calcium and Vitamin D in our diet? Calcium: . Obtaining calcium from the diet is the most preferred way to reach the recommended daily goal. If this goal is not reached through diet, calcium supplements are available.  . Calcium is found in many foods including: dairy products, dark leafy vegetables (like broccoli, kale, and spinach), fish, and fortified products like juices and cereals.  . The food label will have a %DV (percent daily value) listed showing the amount of calcium per serving. To determine the total mg per serving, simply replace the % with zero (0).  For example, Almond Breeze almond milk contains 45% DV of calcium or 4574mper 1 cup.  . You can increase the amount of calcium in your diet  by using more calcium products in your daily meals. Use yogurt and fruit to make smoothies or use yogurt to top baked potatoes or make whipped potatoes. Sprinkle low fat cheese onto salads or into egg white omelets. You can even add non-fat dry milk powder (339m calcium per 1/3 cup) to hot cereals, meat loaf, soups, or potatoes.  . Calcium supplements come in many forms including tablets, chewables, and gummies. Be sure to read the label to determine the correct number of tablets per serving and whether or not to take the supplement with food.  . Calcium carbonate products (Oscal, Caltrate, and Viactiv) are generally better absorbed when taken with food while  calcium citrate products like Citracal can be taken with or without food.  . The body can only absorb about 600 mg of calcium at one time. It is recommended to take calcium supplements in small amounts several times per day.  However, taking it all at once is better than not taking it at all. . Increasing your intake of calcium is essential for bone health, but may also lead to some side effects like constipation, increased gas, bloating or abdominal cramping. To help reduce these side effects, start with 1 tablet per day and slowly increase your intake of the supplement to the recommended doses. It is also recommended that you drink plenty of water each day. Vitamin D: . Very few foods naturally contain vitamin D. However, it is found in saltwater fish (like tuna, salmon and mackerel), beef liver, egg yolks, cheese and vitamin D fortified foods (like yogurt, cereals, orange juice and milk) . The amount of vitamin D in each food or product is listed as %DV on the product label. To determine the total amount of vitamin D per serving, drop the % sign and multiply the number by 4. For example, 1 cup of Almond Breeze almond milk contains 25% DV vitamin D or 100 IU per serving (25 x 4 =100). . Vitamin D is also found in multivitamins and supplements and may be listed as ergocalciferol (vitamin D2) or cholecalciferol (vitamin D3). Each of these forms of vitamin D are equivalent and the daily recommended intake will vary based on your age and the vitamin D levels in your body. Follow your doctor's recommendation for vitamin D intake.                    Exercise for Strong Bones  Exercise is important to build and maintain strong bones / bone density.  There are 2 types of exercises that are important to building and maintaining strong bones:  Weight- bearing and muscle-stregthening.  Weight-bearing Exercises  These exercises include activities that make you move against gravity while staying upright.  Weight-bearing exercises can be high-impact or low-impact.  High-impact weight-bearing exercises help build bones and keep them strong. If you have broken a bone due to osteoporosis or are at risk of breaking a bone, you may need to avoid high-impact exercises. If you're not sure, you should check with your healthcare provider.  Examples of high-impact weight-bearing exercises are: Dancing  Doing high-impact aerobics  Hiking  Jogging/running  Jumping Rope  Stair climbing  Tennis  Low-impact weight-bearing exercises can also help keep bones strong and are a safe alternative if you cannot do high-impact exercises.   Examples of low-impact weight-bearing exercises are: Using elliptical training machines  Doing low-impact aerobics  Using stair-step machines  Fast walking on a treadmill or outside   Muscle-Strengthening Exercises These  exercises include activities where you move your body, a weight or some other resistance against gravity. They are also known as resistance exercises and include: Lifting weights  Using elastic exercise bands  Using weight machines  Lifting your own body weight  Functional movements, such as standing and rising up on your toes  Yoga and Pilates can also improve strength, balance and flexibility. However, certain positions may not be safe for people with osteoporosis or those at increased risk of broken bones. For example, exercises that have you bend forward may increase the chance of breaking a bone in the spine.   Non-Impact Exercises There are other types of exercises that can help prevent falls.  Non-impact exercises can help you to improve balance, posture and how well you move in everyday activities. Some of these exercises include: Balance exercises that strengthen your legs and test your balance, such as Tai Chi, can decrease your risk of falls.  Posture exercises that improve your posture and reduce rounded or "sloping" shoulders can help you  decrease the chance of breaking a bone, especially in the spine.  Functional exercises that improve how well you move can help you with everyday activities and decrease your chance of falling and breaking a bone. For example, if you have trouble getting up from a chair or climbing stairs, you should do these activities as exercises.   **A physical therapist can teach you balance, posture and functional exercises. He/she can also help you learn which exercises are safe and appropriate for you.  [facility] has a physical therapy office in [place] in front of our office and referrals can be made for assessments and treatment as needed and strength and balance training.  If you would like to have an assessment with [facility] and our physical therapy team please let a nurse or provider know.   Fall Prevention in the Home Falls can cause injuries and can affect people from all age groups. There are many simple things that you can do to make your home safe and to help prevent falls. What can I do on the outside of my home?  Regularly repair the edges of walkways and driveways and fix any cracks.  Remove high doorway thresholds.  Trim any shrubbery on the main path into your home.  Use bright outdoor lighting.  Clear walkways of debris and clutter, including tools and rocks.  Regularly check that handrails are securely fastened and in good repair. Both sides of any steps should have handrails.  Install guardrails along the edges of any raised decks or porches.  Have leaves, snow, and ice cleared regularly.  Use sand or salt on walkways during winter months.  In the garage, clean up any spills right away, including grease or oil spills. What can I do in the bathroom?  Use night lights.  Install grab bars by the toilet and in the tub and shower. Do not use towel bars as grab bars.  Use non-skid mats or decals on the floor of the tub or shower.  If you need to sit down while you are in the  shower, use a plastic, non-slip stool.  Keep the floor dry. Immediately clean up any water that spills on the floor.  Remove soap buildup in the tub or shower on a regular basis.  Attach bath mats securely with double-sided non-slip rug tape.  Remove throw rugs and other tripping hazards from the floor. What can I do in the bedroom?  Use night lights.  Make sure that a bedside light is easy to reach.  Do not use oversized bedding that drapes onto the floor.  Have a firm chair that has side arms to use for getting dressed.  Remove throw rugs and other tripping hazards from the floor. What can I do in the kitchen?  Clean up any spills right away.  Avoid walking on wet floors.  Place frequently used items in easy-to-reach places.  If you need to reach for something above you, use a sturdy step stool that has a grab bar.  Keep electrical cables out of the way.  Do not use floor polish or wax that makes floors slippery. If you have to use wax, make sure that it is non-skid floor wax.  Remove throw rugs and other tripping hazards from the floor. What can I do in the stairways?  Do not leave any items on the stairs.  Make sure that there are handrails on both sides of the stairs. Fix handrails that are broken or loose. Make sure that handrails are as long as the stairways.  Check any carpeting to make sure that it is firmly attached to the stairs. Fix any carpet that is loose or worn.  Avoid having throw rugs at the top or bottom of stairways, or secure the rugs with carpet tape to prevent them from moving.  Make sure that you have a light switch at the top of the stairs and the bottom of the stairs. If you do not have them, have them installed. What are some other fall prevention tips?  Wear closed-toe shoes that fit well and support your feet. Wear shoes that have rubber soles or low heels.  When you use a stepladder, make sure that it is completely opened and that the  sides are firmly locked. Have someone hold the ladder while you are using it. Do not climb a closed stepladder.  Add color or contrast paint or tape to grab bars and handrails in your home. Place contrasting color strips on the first and last steps.  Use mobility aids as needed, such as canes, walkers, scooters, and crutches.  Turn on lights if it is dark. Replace any light bulbs that burn out.  Set up furniture so that there are clear paths. Keep the furniture in the same spot.  Fix any uneven floor surfaces.  Choose a carpet design that does not hide the edge of steps of a stairway.  Be aware of any and all pets.  Review your medicines with your healthcare provider. Some medicines can cause dizziness or changes in blood pressure, which increase your risk of falling. Talk with your health care provider about other ways that you can decrease your risk of falls. This may include working with a physical therapist or trainer to improve your strength, balance, and endurance. This information is not intended to replace advice given to you by your health care provider. Make sure you discuss any questions you have with your health care provider. Document Released: 08/28/2002 Document Revised: 02/04/2016 Document Reviewed: 10/12/2014 Elsevier Interactive Patient Education  2017 [employer].

## 2017-03-31 ENCOUNTER — Other Ambulatory Visit: Payer: BLUE CROSS/BLUE SHIELD

## 2017-03-31 ENCOUNTER — Telehealth: Payer: Self-pay | Admitting: Pharmacist

## 2017-03-31 DIAGNOSIS — M80872G Other osteoporosis with current pathological fracture, left ankle and foot, subsequent encounter for fracture with delayed healing: Secondary | ICD-10-CM

## 2017-03-31 LAB — BASIC METABOLIC PANEL
BUN / CREAT RATIO: 22 (ref 9–23)
BUN: 15 mg/dL (ref 6–24)
CO2: 25 mmol/L (ref 20–29)
CREATININE: 0.68 mg/dL (ref 0.57–1.00)
Calcium: 10.5 mg/dL — ABNORMAL HIGH (ref 8.7–10.2)
Chloride: 100 mmol/L (ref 96–106)
GFR calc non Af Amer: 99 mL/min/{1.73_m2} (ref >59)
GFR, EST AFRICAN AMERICAN: 114 mL/min/{1.73_m2} (ref >59)
Glucose: 84 mg/dL (ref 65–99)
Potassium: 4.3 mmol/L (ref 3.5–5.2)
Sodium: 141 mmol/L (ref 134–144)

## 2017-03-31 MED ORDER — TERIPARATIDE (RECOMBINANT) 600 MCG/2.4ML ~~LOC~~ SOLN
20.0000 ug | Freq: Every day | SUBCUTANEOUS | 1 refills | Status: DC
Start: 2017-03-31 — End: 2017-04-02

## 2017-03-31 MED ORDER — ALENDRONATE SODIUM 70 MG PO TABS: 70.0000 mg | ORAL_TABLET | ORAL | 11 refills | Status: DC

## 2017-03-31 NOTE — Addendum Note (Signed)
Addended by: Henrene PastorECKARD, Suzanne Kho B on: 03/31/2017 11:01 AM   Modules accepted: Orders

## 2017-03-31 NOTE — Telephone Encounter (Signed)
I have spoken with Dr Victorino DikeHewitt regarding DEXA and pharmacotherapy recommendations.  He recommended Forteo for 3 month to hopefully stimulate healing of current fracture and building of BMD.  Will then follow with oral bisphosphonate - alendronate 70mg  take 1 tablet daily.  Patient will need PTH and BMET checked prior to initiation of meds.  Patient aware of above.

## 2017-04-01 LAB — PARATHYROID HORMONE, INTACT (NO CA): PTH: 13 pg/mL — AB (ref 15–65)

## 2017-04-02 ENCOUNTER — Other Ambulatory Visit: Payer: Self-pay | Admitting: Pharmacist

## 2017-04-02 ENCOUNTER — Telehealth: Payer: Self-pay | Admitting: Physician Assistant

## 2017-04-02 ENCOUNTER — Ambulatory Visit: Payer: Self-pay | Admitting: Pharmacist

## 2017-04-02 DIAGNOSIS — E200 Idiopathic hypoparathyroidism: Secondary | ICD-10-CM

## 2017-04-02 NOTE — Telephone Encounter (Signed)
PTH was low and calcium elelvated - per Dr Dettinger recommendation referral to endocrinology  Pt requested Dr Reino KentGherke.  Patient was advised to not start calcium supplement or Forteo yet - will wait until endo w/u complete

## 2017-04-02 NOTE — Progress Notes (Signed)
Patient ID: Jenny ReichmannDebra Corso, female   DOB: 09/12/1961, 56 y.o.   MRN: [id number]  Opened in error

## 2017-04-08 ENCOUNTER — Encounter: Payer: Self-pay | Admitting: Physician Assistant

## 2017-04-08 ENCOUNTER — Telehealth: Payer: Self-pay | Admitting: Physician Assistant

## 2017-04-08 NOTE — Telephone Encounter (Signed)
Patient has appt with endo but not until August 30th.  She had specifically asked for Dr. Carlus Pavlovristina Gherghe.  She would be willing to see someone else if she can get sooner appointment.  Will send request to our referral department.

## 2017-04-08 NOTE — Telephone Encounter (Signed)
Reviewed referral request and looks like patient has already spoken with Jamse Belfastebbi Boles and she is working to get patient in sooner with Dr Talmage NapBalan.

## 2017-04-12 ENCOUNTER — Ambulatory Visit: Payer: BLUE CROSS/BLUE SHIELD

## 2017-04-13 ENCOUNTER — Ambulatory Visit: Payer: BLUE CROSS/BLUE SHIELD

## 2017-04-13 ENCOUNTER — Encounter: Payer: BLUE CROSS/BLUE SHIELD | Admitting: Physician Assistant

## 2017-04-13 DIAGNOSIS — M8580 Other specified disorders of bone density and structure, unspecified site: Secondary | ICD-10-CM | POA: Diagnosis not present

## 2017-04-19 ENCOUNTER — Telehealth: Payer: Self-pay | Admitting: Physician Assistant

## 2017-04-19 MED ORDER — CALCIUM CARBONATE-VITAMIN D 500-200 MG-UNIT PO TABS: 1.0000 | ORAL_TABLET | Freq: Two times a day (BID) | ORAL | Status: DC

## 2017-04-19 NOTE — Telephone Encounter (Signed)
Patient saw Dr Talmage NapBalan last week.  She recommended not starting Forteo but she started alendroante 70mg  weekly instead.  Also Dr Talmage NapBalan recommended patient get calcium + vitamin D bid.  Patient is to follow up with Dr Talmage NapBalan in 2 months.

## 2017-04-21 DIAGNOSIS — M84362D Stress fracture, left tibia, subsequent encounter for fracture with routine healing: Secondary | ICD-10-CM | POA: Diagnosis not present

## 2017-04-23 ENCOUNTER — Telehealth: Payer: Self-pay | Admitting: Physician Assistant

## 2017-04-23 DIAGNOSIS — B029 Zoster without complications: Secondary | ICD-10-CM

## 2017-04-23 MED ORDER — VALACYCLOVIR HCL 500 MG PO TABS: ORAL_TABLET | ORAL | 0 refills | Status: DC

## 2017-04-23 NOTE — Telephone Encounter (Signed)
Pt notified of RX 

## 2017-04-23 NOTE — Telephone Encounter (Signed)
Please review and advise.

## 2017-04-23 NOTE — Telephone Encounter (Signed)
Go ahead and send a refill of Valtrex for her.

## 2017-04-23 NOTE — Telephone Encounter (Signed)
What is the name of the medication? Valtrex for fever blisters  Have you contacted your pharmacy to request a refill? yes  Which pharmacy would you like this sent to? [facility].   Patient notified that their request is being sent to the clinical staff for review and that they should receive a call once it is complete. If they do not receive a call within 24 hours they can check with their pharmacy or our office.

## 2017-04-29 ENCOUNTER — Ambulatory Visit (INDEPENDENT_AMBULATORY_CARE_PROVIDER_SITE_OTHER): Payer: BLUE CROSS/BLUE SHIELD | Admitting: *Deleted

## 2017-04-29 DIAGNOSIS — Z23 Encounter for immunization: Secondary | ICD-10-CM | POA: Diagnosis not present

## 2017-04-29 NOTE — Progress Notes (Signed)
Pt given Shingrix #2 vaccine Tolerated well 

## 2017-05-19 DIAGNOSIS — M84362D Stress fracture, left tibia, subsequent encounter for fracture with routine healing: Secondary | ICD-10-CM | POA: Diagnosis not present

## 2017-05-20 ENCOUNTER — Ambulatory Visit: Payer: BLUE CROSS/BLUE SHIELD | Admitting: Endocrinology

## 2017-06-16 ENCOUNTER — Encounter: Payer: BLUE CROSS/BLUE SHIELD | Admitting: Physician Assistant

## 2017-06-16 DIAGNOSIS — M84364D Stress fracture, left fibula, subsequent encounter for fracture with routine healing: Secondary | ICD-10-CM | POA: Diagnosis not present

## 2017-06-16 DIAGNOSIS — M84362D Stress fracture, left tibia, subsequent encounter for fracture with routine healing: Secondary | ICD-10-CM | POA: Diagnosis not present

## 2017-06-30 ENCOUNTER — Other Ambulatory Visit: Payer: Self-pay | Admitting: Obstetrics & Gynecology

## 2017-06-30 DIAGNOSIS — Z1231 Encounter for screening mammogram for malignant neoplasm of breast: Secondary | ICD-10-CM

## 2017-07-20 ENCOUNTER — Encounter: Payer: Self-pay | Admitting: Physician Assistant

## 2017-07-20 ENCOUNTER — Ambulatory Visit (INDEPENDENT_AMBULATORY_CARE_PROVIDER_SITE_OTHER): Payer: BLUE CROSS/BLUE SHIELD | Admitting: Physician Assistant

## 2017-07-20 VITALS — BP 100/74 | HR 72 | Temp 98.4°F | Ht 62.0 in | Wt 163.4 lb

## 2017-07-20 DIAGNOSIS — Z23 Encounter for immunization: Secondary | ICD-10-CM

## 2017-07-20 DIAGNOSIS — Z0000 Encounter for general adult medical examination without abnormal findings: Secondary | ICD-10-CM

## 2017-07-20 DIAGNOSIS — Z713 Dietary counseling and surveillance: Secondary | ICD-10-CM | POA: Insufficient documentation

## 2017-07-20 DIAGNOSIS — E7800 Pure hypercholesterolemia, unspecified: Secondary | ICD-10-CM

## 2017-07-20 NOTE — Patient Instructions (Signed)
In a few days you may receive a survey in the mail or online from [employer] regarding your visit with us today. Please take a moment to fill this out. Your feedback is very important to our whole office. It can help us better understand your needs as well as improve your experience and satisfaction. Thank you for taking your time to complete it. We care about you.  Annell Canty, PA-C  

## 2017-07-20 NOTE — Progress Notes (Signed)
BP 100/74   Pulse 72   Temp 98.4 F (36.9 C) (Oral)   Ht 5' 2"  (1.575 m)   Wt 163 lb 6.4 oz (74.1 kg)   LMP 10/22/2014   BMI 29.89 kg/m    Subjective:    Patient ID: Erika Ray, female    DOB: [birth year], 56 y.o.   MRN: [id number]  HPI: Latania Bascomb is a 56 y.o. female presenting on 07/20/2017 for Annual Exam  This patient comes in for annual well physical examination. All medications are reviewed today. There are no reports of any problems with the medications. All of the medical conditions are reviewed and updated.  Lab work is reviewed and will be ordered as medically necessary. There are no new problems reported with today's visit.  Patient reports doing well overall.  The patient is due a colonoscopy in the next year.  Patient is having her eye exam in November.  Her mammogram comes due in December.  She is due a DEXA scan in 2020.  Relevant past medical, surgical, family and social history reviewed and updated as indicated. Allergies and medications reviewed and updated.  Past Medical History:  Diagnosis Date  . Broken arm 08/1966   left  . Rectal bleeding   . Rectal pain   . Thrombosed hemorrhoids     Past Surgical History:  Procedure Laterality Date  . FRACTURE SURGERY  1967   left arm  . HEMORROIDECTOMY  06/11/11   thromb hems    Review of Systems  Constitutional: Negative.  Negative for activity change, fatigue and fever.  HENT: Negative.   Eyes: Negative.   Respiratory: Negative.  Negative for cough.   Cardiovascular: Negative.  Negative for chest pain.  Gastrointestinal: Negative.  Negative for abdominal pain.  Endocrine: Negative.   Genitourinary: Negative.  Negative for dysuria.  Musculoskeletal: Negative.   Skin: Negative.   Neurological: Negative.     Allergies as of 07/20/2017      Reactions   Ampicillin Rash   Penicillins Hives   All over the body, except the face.      Medication List       Accurate as of 07/20/17 11:01 AM. Always  use your most recent med list.          alendronate 70 MG tablet Commonly known as:  FOSAMAX Take 1 tablet (70 mg total) by mouth every 7 (seven) days. Take with a full glass of water on an empty stomach.   gabapentin 300 MG capsule Commonly known as:  NEURONTIN Take 1-2 capsules (300-600 mg total) by mouth 3 (three) times daily.   OVER THE COUNTER MEDICATION Vitafusion 1000units   RESTASIS 0.05 % ophthalmic emulsion Generic drug:  cycloSPORINE   valACYclovir 500 MG tablet Commonly known as:  VALTREX 1 PO 3x a day X7 days          Objective:    BP 100/74   Pulse 72   Temp 98.4 F (36.9 C) (Oral)   Ht 5' 2"  (1.575 m)   Wt 163 lb 6.4 oz (74.1 kg)   LMP 10/22/2014   BMI 29.89 kg/m   Allergies  Allergen Reactions  . Ampicillin Rash  . Penicillins Hives    All over the body, except the face.    Physical Exam  Constitutional: She is oriented to person, place, and time. She appears well-developed and well-nourished.  HENT:  Head: Normocephalic and atraumatic.  Right Ear: Tympanic membrane, external ear and ear canal normal.  Left  Ear: Tympanic membrane, external ear and ear canal normal.  Nose: Nose normal. No rhinorrhea.  Mouth/Throat: Oropharynx is clear and moist and mucous membranes are normal. No oropharyngeal exudate or posterior oropharyngeal erythema.  Eyes: Pupils are equal, round, and reactive to light. Conjunctivae and EOM are normal.  Neck: Normal range of motion. Neck supple.  Cardiovascular: Normal rate, regular rhythm, normal heart sounds and intact distal pulses.   Pulmonary/Chest: Effort normal and breath sounds normal.  Abdominal: Soft. Bowel sounds are normal.  Neurological: She is alert and oriented to person, place, and time. She has normal reflexes.  Skin: Skin is warm and dry. No rash noted.  Psychiatric: She has a normal mood and affect. Her behavior is normal. Judgment and thought content normal.  Nursing note and vitals reviewed.        Assessment & Plan:   1. Well adult exam - CMP14+EGFR - CBC with Differential/Platelet - Lipid panel   Current Outpatient Prescriptions:  .  alendronate (FOSAMAX) 70 MG tablet, Take 1 tablet (70 mg total) by mouth every 7 (seven) days. Take with a full glass of water on an empty stomach., Disp: 4 tablet, Rfl: 11 .  gabapentin (NEURONTIN) 300 MG capsule, Take 1-2 capsules (300-600 mg total) by mouth 3 (three) times daily., Disp: 180 capsule, Rfl: 2 .  OVER THE COUNTER MEDICATION, Vitafusion 1000units, Disp: , Rfl:  .  RESTASIS 0.05 % ophthalmic emulsion, , Disp: , Rfl: 2 .  valACYclovir (VALTREX) 500 MG tablet, 1 PO 3x a day X7 days, Disp: 21 tablet, Rfl: 0 Continue all other maintenance medications as listed above.  Follow up plan: Return in about 1 year (around 07/20/2018) for well.  Educational handout given for [place] PA-C [facility] [address]  [place], [place] [id number] [phone]   07/20/2017, 11:01 AM

## 2017-07-21 ENCOUNTER — Other Ambulatory Visit: Payer: Self-pay | Admitting: Physician Assistant

## 2017-07-21 LAB — CBC WITH DIFFERENTIAL/PLATELET
BASOS ABS: 0 10*3/uL (ref 0.0–0.2)
Basos: 0 %
EOS (ABSOLUTE): 0.1 10*3/uL (ref 0.0–0.4)
Eos: 2 %
HEMATOCRIT: 39.3 % (ref 34.0–46.6)
Hemoglobin: 13.6 g/dL (ref 11.1–15.9)
Immature Grans (Abs): 0 10*3/uL (ref 0.0–0.1)
Immature Granulocytes: 0 %
LYMPHS ABS: 1.8 10*3/uL (ref 0.7–3.1)
Lymphs: 22 %
MCH: 30.6 pg (ref 26.6–33.0)
MCHC: 34.6 g/dL (ref 31.5–35.7)
MCV: 89 fL (ref 79–97)
MONOS ABS: 0.6 10*3/uL (ref 0.1–0.9)
Monocytes: 8 %
NEUTROS ABS: 5.4 10*3/uL (ref 1.4–7.0)
Neutrophils: 68 %
Platelets: 260 10*3/uL (ref 150–379)
RBC: 4.44 x10E6/uL (ref 3.77–5.28)
RDW: 13.6 % (ref 12.3–15.4)
WBC: 7.9 10*3/uL (ref 3.4–10.8)

## 2017-07-21 LAB — CMP14+EGFR
A/G RATIO: 2.2 (ref 1.2–2.2)
ALBUMIN: 4.7 g/dL (ref 3.5–5.5)
ALK PHOS: 84 IU/L (ref 39–117)
ALT: 9 IU/L (ref 0–32)
AST: 16 IU/L (ref 0–40)
BILIRUBIN TOTAL: 0.3 mg/dL (ref 0.0–1.2)
BUN / CREAT RATIO: 27 — AB (ref 9–23)
BUN: 15 mg/dL (ref 6–24)
CHLORIDE: 103 mmol/L (ref 96–106)
CO2: 22 mmol/L (ref 20–29)
Calcium: 9.6 mg/dL (ref 8.7–10.2)
Creatinine, Ser: 0.56 mg/dL — ABNORMAL LOW (ref 0.57–1.00)
GFR calc Af Amer: 121 mL/min/{1.73_m2} (ref >59)
GFR calc non Af Amer: 105 mL/min/{1.73_m2} (ref >59)
GLOBULIN, TOTAL: 2.1 g/dL (ref 1.5–4.5)
Glucose: 90 mg/dL (ref 65–99)
Potassium: 4.3 mmol/L (ref 3.5–5.2)
SODIUM: 142 mmol/L (ref 134–144)
Total Protein: 6.8 g/dL (ref 6.0–8.5)

## 2017-07-21 LAB — LIPID PANEL
CHOL/HDL RATIO: 2.9 ratio (ref 0.0–4.4)
CHOLESTEROL TOTAL: 250 mg/dL — AB (ref 100–199)
HDL: 85 mg/dL (ref >39)
LDL Calculated: 148 mg/dL — ABNORMAL HIGH (ref 0–99)
TRIGLYCERIDES: 84 mg/dL (ref 0–149)
VLDL Cholesterol Cal: 17 mg/dL (ref 5–40)

## 2017-07-21 MED ORDER — ATORVASTATIN CALCIUM 10 MG PO TABS: 10.0000 mg | ORAL_TABLET | Freq: Every day | ORAL | 3 refills | Status: DC

## 2017-07-21 NOTE — Addendum Note (Signed)
Addended by: Tamera PuntWRAY, WENDY S on: 07/21/2017 11:42 AM   Modules accepted: Orders

## 2017-09-17 ENCOUNTER — Ambulatory Visit
Admission: RE | Admit: 2017-09-17 | Discharge: 2017-09-17 | Disposition: A | Discharge status: Home / Self Care | Payer: BLUE CROSS/BLUE SHIELD | Source: Ambulatory Visit | Attending: Obstetrics & Gynecology | Admitting: Obstetrics & Gynecology

## 2017-09-17 DIAGNOSIS — Z1231 Encounter for screening mammogram for malignant neoplasm of breast: Secondary | ICD-10-CM

## 2017-09-27 ENCOUNTER — Other Ambulatory Visit: Payer: BLUE CROSS/BLUE SHIELD

## 2017-09-27 DIAGNOSIS — E7800 Pure hypercholesterolemia, unspecified: Secondary | ICD-10-CM

## 2017-09-28 LAB — HEPATIC FUNCTION PANEL
ALBUMIN: 4.5 g/dL (ref 3.5–5.5)
ALK PHOS: 79 IU/L (ref 39–117)
ALT: 17 IU/L (ref 0–32)
AST: 19 IU/L (ref 0–40)
Bilirubin Total: 0.4 mg/dL (ref 0.0–1.2)
Bilirubin, Direct: 0.1 mg/dL (ref 0.00–0.40)
Total Protein: 6.5 g/dL (ref 6.0–8.5)

## 2017-09-28 LAB — LIPID PANEL
Chol/HDL Ratio: 2 ratio (ref 0.0–4.4)
Cholesterol, Total: 177 mg/dL (ref 100–199)
HDL: 88 mg/dL (ref >39)
LDL Calculated: 78 mg/dL (ref 0–99)
Triglycerides: 55 mg/dL (ref 0–149)
VLDL CHOLESTEROL CAL: 11 mg/dL (ref 5–40)

## 2017-11-05 DIAGNOSIS — Z86010 Personal history of colonic polyps: Secondary | ICD-10-CM | POA: Diagnosis not present

## 2017-11-05 DIAGNOSIS — K649 Unspecified hemorrhoids: Secondary | ICD-10-CM | POA: Diagnosis not present

## 2017-11-05 LAB — HM COLONOSCOPY

## 2017-11-17 DIAGNOSIS — Z6828 Body mass index (BMI) 28.0-28.9, adult: Secondary | ICD-10-CM | POA: Diagnosis not present

## 2017-11-17 DIAGNOSIS — Z01419 Encounter for gynecological examination (general) (routine) without abnormal findings: Secondary | ICD-10-CM | POA: Diagnosis not present

## 2017-12-23 ENCOUNTER — Telehealth: Payer: Self-pay | Admitting: Physician Assistant

## 2017-12-23 NOTE — Telephone Encounter (Signed)
Patient aware that she is due for labs in July. Patient states that she had colonoscopy and she advised to call [facility] and ask them to send us report and that will mark it complete.

## 2017-12-28 ENCOUNTER — Encounter: Payer: Self-pay | Admitting: *Deleted

## 2018-01-23 DIAGNOSIS — J069 Acute upper respiratory infection, unspecified: Secondary | ICD-10-CM | POA: Diagnosis not present

## 2018-01-31 ENCOUNTER — Ambulatory Visit: Payer: BLUE CROSS/BLUE SHIELD | Admitting: Nurse Practitioner

## 2018-01-31 ENCOUNTER — Encounter: Payer: Self-pay | Admitting: Nurse Practitioner

## 2018-01-31 ENCOUNTER — Telehealth: Payer: Self-pay | Admitting: Physician Assistant

## 2018-01-31 VITALS — BP 109/68 | HR 71 | Temp 98.1°F | Ht 62.0 in | Wt 156.4 lb

## 2018-01-31 DIAGNOSIS — J40 Bronchitis, not specified as acute or chronic: Secondary | ICD-10-CM

## 2018-01-31 DIAGNOSIS — B029 Zoster without complications: Secondary | ICD-10-CM

## 2018-01-31 MED ORDER — VALACYCLOVIR HCL 500 MG PO TABS: ORAL_TABLET | ORAL | 0 refills | Status: DC

## 2018-01-31 MED ORDER — LEVOCETIRIZINE DIHYDROCHLORIDE 5 MG PO TABS: 5.0000 mg | ORAL_TABLET | Freq: Every evening | ORAL | 1 refills | Status: DC

## 2018-01-31 MED ORDER — HYDROCODONE-HOMATROPINE 5-1.5 MG/5ML PO SYRP: 5.0000 mL | ORAL_SOLUTION | Freq: Four times a day (QID) | ORAL | 0 refills | Status: DC | PRN

## 2018-01-31 MED ORDER — PREDNISONE 20 MG PO TABS: ORAL_TABLET | ORAL | 0 refills | Status: DC

## 2018-01-31 MED ORDER — BENZONATATE 100 MG PO CAPS: 100.0000 mg | ORAL_CAPSULE | Freq: Three times a day (TID) | ORAL | 0 refills | Status: DC | PRN

## 2018-01-31 NOTE — Progress Notes (Signed)
   Subjective:    Patient ID: Erika Ray, female    DOB: 04/26/1961, 57 y.o.   MRN: [id number]   Chief Complaint: Cough   HPI Pateint coems in today c/o cough. She went to urgent care  A week ago Snday with cough. Was given prednisone, doxycycline and cough meds. Sh eis better but she still has a cough. Denies fever or body aches.    Review of Systems  Constitutional: Negative for activity change and appetite change.  HENT: Negative.   Eyes: Negative for pain.  Respiratory: Positive for cough (dry). Negative for shortness of breath.   Cardiovascular: Negative for chest pain, palpitations and leg swelling.  Gastrointestinal: Negative for abdominal pain.  Endocrine: Negative for polydipsia.  Genitourinary: Negative.   Skin: Negative for rash.  Neurological: Negative for dizziness, weakness and headaches.  Hematological: Does not bruise/bleed easily.  Psychiatric/Behavioral: Negative.   All other systems reviewed and are negative.      Objective:   Physical Exam  Constitutional: She is oriented to person, place, and time. She appears well-developed and well-nourished. She appears distressed (mild).  HENT:  Right Ear: Hearing, tympanic membrane, external ear and ear canal normal.  Left Ear: Hearing, tympanic membrane, external ear and ear canal normal.  Mouth/Throat: Uvula is midline, oropharynx is clear and moist and mucous membranes are normal.  Cardiovascular: Normal rate and regular rhythm.  Pulmonary/Chest: Effort normal and breath sounds normal.  Deep wet cough   Neurological: She is alert and oriented to person, place, and time.  Skin: Skin is warm.  Psychiatric: She has a normal mood and affect. Her behavior is normal. Judgment and thought content normal.   BP 109/68   Pulse 71   Temp 98.1 F (36.7 C) (Oral)   Ht  (1.575 m)   Wt 156 lb 6 oz (70.9 kg)   LMP 10/22/2014   BMI 28.60 kg/m        Assessment & Plan:  Erika Ray in today with chief  complaint of Cough   1. Bronchitis Force fluids Run humidifier Take tessalon perles during day and hycodan at bedtime. RTO prn - HYDROcodone-homatropine (HYCODAN) 5-1.5 MG/5ML syrup; Take 5 mLs by mouth every 6 (six) hours as needed for cough.  Dispense: 120 mL; Refill: 0 - benzonatate (TESSALON PERLES) 100 MG capsule; Take 1 capsule (100 mg total) by mouth 3 (three) times daily as needed for cough.  Dispense: 20 capsule; Refill: 0 - predniSONE (DELTASONE) 20 MG tablet; 2 po at sametime daily for 5 days  Dispense: 10 tablet; Refill: 0  Mary-Margaret Daphine Deutscher, FNP

## 2018-01-31 NOTE — Patient Instructions (Signed)

## 2018-03-14 ENCOUNTER — Other Ambulatory Visit: Payer: BLUE CROSS/BLUE SHIELD

## 2018-03-14 DIAGNOSIS — E7800 Pure hypercholesterolemia, unspecified: Secondary | ICD-10-CM

## 2018-03-15 LAB — CMP14+EGFR
ALBUMIN: 4.7 g/dL (ref 3.5–5.5)
ALT: 11 IU/L (ref 0–32)
AST: 16 IU/L (ref 0–40)
Albumin/Globulin Ratio: 2.6 — ABNORMAL HIGH (ref 1.2–2.2)
Alkaline Phosphatase: 71 IU/L (ref 39–117)
BUN / CREAT RATIO: 22 (ref 9–23)
BUN: 13 mg/dL (ref 6–24)
Bilirubin Total: 0.4 mg/dL (ref 0.0–1.2)
CALCIUM: 9.5 mg/dL (ref 8.7–10.2)
CO2: 24 mmol/L (ref 20–29)
CREATININE: 0.6 mg/dL (ref 0.57–1.00)
Chloride: 105 mmol/L (ref 96–106)
GFR calc Af Amer: 118 mL/min/{1.73_m2} (ref >59)
GFR, EST NON AFRICAN AMERICAN: 102 mL/min/{1.73_m2} (ref >59)
GLOBULIN, TOTAL: 1.8 g/dL (ref 1.5–4.5)
Glucose: 81 mg/dL (ref 65–99)
Potassium: 4.8 mmol/L (ref 3.5–5.2)
SODIUM: 144 mmol/L (ref 134–144)
Total Protein: 6.5 g/dL (ref 6.0–8.5)

## 2018-03-15 LAB — LIPID PANEL
CHOL/HDL RATIO: 2.2 ratio (ref 0.0–4.4)
Cholesterol, Total: 185 mg/dL (ref 100–199)
HDL: 83 mg/dL (ref >39)
LDL Calculated: 89 mg/dL (ref 0–99)
Triglycerides: 66 mg/dL (ref 0–149)
VLDL Cholesterol Cal: 13 mg/dL (ref 5–40)

## 2018-04-20 ENCOUNTER — Ambulatory Visit: Payer: BLUE CROSS/BLUE SHIELD | Admitting: Physician Assistant

## 2018-05-27 DIAGNOSIS — L821 Other seborrheic keratosis: Secondary | ICD-10-CM | POA: Diagnosis not present

## 2018-05-27 DIAGNOSIS — D225 Melanocytic nevi of trunk: Secondary | ICD-10-CM | POA: Diagnosis not present

## 2018-05-27 DIAGNOSIS — Z1283 Encounter for screening for malignant neoplasm of skin: Secondary | ICD-10-CM | POA: Diagnosis not present

## 2018-05-27 DIAGNOSIS — D2272 Melanocytic nevi of left lower limb, including hip: Secondary | ICD-10-CM | POA: Diagnosis not present

## 2018-06-11 ENCOUNTER — Other Ambulatory Visit: Payer: Self-pay | Admitting: Physician Assistant

## 2018-07-22 ENCOUNTER — Other Ambulatory Visit: Payer: BLUE CROSS/BLUE SHIELD | Admitting: Physician Assistant

## 2018-08-12 ENCOUNTER — Telehealth: Payer: Self-pay | Admitting: Physician Assistant

## 2018-08-12 ENCOUNTER — Encounter: Payer: Self-pay | Admitting: Physician Assistant

## 2018-08-12 ENCOUNTER — Ambulatory Visit (INDEPENDENT_AMBULATORY_CARE_PROVIDER_SITE_OTHER): Payer: Managed Care, Other (non HMO) | Admitting: Physician Assistant

## 2018-08-12 VITALS — BP 110/73 | HR 77 | Temp 97.6°F | Ht 62.0 in | Wt 160.2 lb

## 2018-08-12 DIAGNOSIS — Z0000 Encounter for general adult medical examination without abnormal findings: Secondary | ICD-10-CM | POA: Diagnosis not present

## 2018-08-12 DIAGNOSIS — Z23 Encounter for immunization: Secondary | ICD-10-CM | POA: Diagnosis not present

## 2018-08-12 MED ORDER — NAPROXEN 500 MG PO TABS: 500.0000 mg | ORAL_TABLET | Freq: Two times a day (BID) | ORAL | 2 refills | Status: DC

## 2018-08-12 NOTE — Patient Instructions (Signed)

## 2018-08-12 NOTE — Telephone Encounter (Signed)
Added to HM

## 2018-08-13 LAB — CMP14+EGFR
ALBUMIN: 4.5 g/dL (ref 3.5–5.5)
ALT: 13 IU/L (ref 0–32)
AST: 19 IU/L (ref 0–40)
Albumin/Globulin Ratio: 2.4 — ABNORMAL HIGH (ref 1.2–2.2)
Alkaline Phosphatase: 72 IU/L (ref 39–117)
BILIRUBIN TOTAL: 0.4 mg/dL (ref 0.0–1.2)
BUN/Creatinine Ratio: 20 (ref 9–23)
BUN: 13 mg/dL (ref 6–24)
CHLORIDE: 106 mmol/L (ref 96–106)
CO2: 23 mmol/L (ref 20–29)
CREATININE: 0.64 mg/dL (ref 0.57–1.00)
Calcium: 9.3 mg/dL (ref 8.7–10.2)
GFR calc Af Amer: 115 mL/min/{1.73_m2} (ref >59)
GFR calc non Af Amer: 100 mL/min/{1.73_m2} (ref >59)
GLOBULIN, TOTAL: 1.9 g/dL (ref 1.5–4.5)
GLUCOSE: 76 mg/dL (ref 65–99)
Potassium: 3.8 mmol/L (ref 3.5–5.2)
SODIUM: 144 mmol/L (ref 134–144)
Total Protein: 6.4 g/dL (ref 6.0–8.5)

## 2018-08-13 LAB — CBC WITH DIFFERENTIAL/PLATELET
BASOS ABS: 0 10*3/uL (ref 0.0–0.2)
Basos: 1 %
EOS (ABSOLUTE): 0.1 10*3/uL (ref 0.0–0.4)
Eos: 1 %
HEMATOCRIT: 37.7 % (ref 34.0–46.6)
HEMOGLOBIN: 13 g/dL (ref 11.1–15.9)
Immature Grans (Abs): 0 10*3/uL (ref 0.0–0.1)
Immature Granulocytes: 0 %
Lymphocytes Absolute: 2.2 10*3/uL (ref 0.7–3.1)
Lymphs: 28 %
MCH: 30 pg (ref 26.6–33.0)
MCHC: 34.5 g/dL (ref 31.5–35.7)
MCV: 87 fL (ref 79–97)
MONOS ABS: 0.7 10*3/uL (ref 0.1–0.9)
Monocytes: 8 %
NEUTROS ABS: 4.9 10*3/uL (ref 1.4–7.0)
Neutrophils: 62 %
Platelets: 238 10*3/uL (ref 150–450)
RBC: 4.33 x10E6/uL (ref 3.77–5.28)
RDW: 12.4 % (ref 12.3–15.4)
WBC: 8 10*3/uL (ref 3.4–10.8)

## 2018-08-13 LAB — LIPID PANEL
CHOLESTEROL TOTAL: 178 mg/dL (ref 100–199)
Chol/HDL Ratio: 2 ratio (ref 0.0–4.4)
HDL: 87 mg/dL (ref >39)
LDL CALC: 80 mg/dL (ref 0–99)
TRIGLYCERIDES: 54 mg/dL (ref 0–149)
VLDL Cholesterol Cal: 11 mg/dL (ref 5–40)

## 2018-08-13 LAB — TSH: TSH: 2.03 u[IU]/mL (ref 0.450–4.500)

## 2018-08-13 NOTE — Progress Notes (Signed)
BP 110/73   Pulse 77   Temp 97.6 F (36.4 C) (Oral)   Ht _0  (1.575 m)   Wt 160 lb 3.2 oz (72.7 kg)   LMP 10/22/2014   BMI 29.30 kg/m    Subjective:    Patient ID: Erika Ray, female    DOB: 10-13-1960, 57 y.o.   MRN: [id number]  HPI: Erika Ray is a 57 y.o. female presenting on 08/12/2018 for Annual Exam  This patient comes in for annual well physical examination. All medications are reviewed today. There are no reports of any problems with the medications. All of the medical conditions are reviewed and updated.  Lab work is reviewed and will be ordered as medically necessary. There are no new problems reported with today's visit.  Patient reports doing well overall.   Past Medical History:  Diagnosis Date  . Broken arm 08/1966   left  . Rectal bleeding   . Rectal pain   . Thrombosed hemorrhoids    Relevant past medical, surgical, family and social history reviewed and updated as indicated. Interim medical history since our last visit reviewed. Allergies and medications reviewed and updated. DATA REVIEWED: CHART IN EPIC  Family History reviewed for pertinent findings.  Review of Systems  Constitutional: Negative.  Negative for activity change, fatigue and fever.  HENT: Negative.   Eyes: Negative.   Respiratory: Negative.  Negative for cough.   Cardiovascular: Negative.  Negative for chest pain.  Gastrointestinal: Negative.  Negative for abdominal pain.  Endocrine: Negative.   Genitourinary: Negative.  Negative for dysuria.  Musculoskeletal: Negative.   Skin: Negative.   Neurological: Negative.     Allergies as of 08/12/2018      Reactions   Ampicillin Rash   Penicillins Hives   All over the body, except the face.      Medication List        Accurate as of 08/12/18 11:59 PM. Always use your most recent med list.          alendronate 70 MG tablet Commonly known as:  FOSAMAX Take 1 tablet (70 mg total) by mouth every 7 (seven) days. Take with a  full glass of water on an empty stomach.   atorvastatin 10 MG tablet Commonly known as:  LIPITOR TAKE 1 TABLET DAILY   levocetirizine 5 MG tablet Commonly known as:  XYZAL Take 1 tablet (5 mg total) by mouth every evening.   naproxen 500 MG tablet Commonly known as:  NAPROSYN Take 1 tablet (500 mg total) by mouth 2 (two) times daily with a meal.   OVER THE COUNTER MEDICATION Vitafusion 1000units   valACYclovir 500 MG tablet Commonly known as:  VALTREX 1 PO 3x a day X7 days          Objective:    BP 110/73   Pulse 77   Temp 97.6 F (36.4 C) (Oral)   Ht _1  (1.575 m)   Wt 160 lb 3.2 oz (72.7 kg)   LMP 10/22/2014   BMI 29.30 kg/m   Allergies  Allergen Reactions  . Ampicillin Rash  . Penicillins Hives    All over the body, except the face.    Wt Readings from Last 3 Encounters:  08/12/18 160 lb 3.2 oz (72.7 kg)  01/31/18 156 lb 6 oz (70.9 kg)  07/20/17 163 lb 6.4 oz (74.1 kg)    Physical Exam  Constitutional: She is oriented to person, place, and time. She appears well-developed and well-nourished.  HENT:  Head: Normocephalic and atraumatic.  Right Ear: Tympanic membrane, external ear and ear canal normal.  Left Ear: Tympanic membrane, external ear and ear canal normal.  Nose: Nose normal. No rhinorrhea.  Mouth/Throat: Oropharynx is clear and moist and mucous membranes are normal. No oropharyngeal exudate or posterior oropharyngeal erythema.  Eyes: Pupils are equal, round, and reactive to light. Conjunctivae and EOM are normal.  Neck: Normal range of motion. Neck supple.  Cardiovascular: Normal rate, regular rhythm, normal heart sounds and intact distal pulses.  Pulmonary/Chest: Effort normal and breath sounds normal.  Abdominal: Soft. Bowel sounds are normal.  Neurological: She is alert and oriented to person, place, and time. She has normal reflexes.  Skin: Skin is warm and dry. No rash noted.  Psychiatric: She has a normal mood and affect. Her behavior  is normal. Judgment and thought content normal.    Results for orders placed or performed in visit on 08/12/18  CBC with Differential/Platelet  Result Value Ref Range   WBC 8.0 3.4 - 10.8 x10E3/uL   RBC 4.33 3.77 - 5.28 x10E6/uL   Hemoglobin 13.0 11.1 - 15.9 g/dL   Hematocrit 37.7 34.0 - 46.6 %   MCV 87 79 - 97 fL   MCH 30.0 26.6 - 33.0 pg   MCHC 34.5 31.5 - 35.7 g/dL   RDW 12.4 12.3 - 15.4 %   Platelets 238 150 - 450 x10E3/uL   Neutrophils 62 Not Estab. %   Lymphs 28 Not Estab. %   Monocytes 8 Not Estab. %   Eos 1 Not Estab. %   Basos 1 Not Estab. %   Neutrophils Absolute 4.9 1.4 - 7.0 x10E3/uL   Lymphocytes Absolute 2.2 0.7 - 3.1 x10E3/uL   Monocytes Absolute 0.7 0.1 - 0.9 x10E3/uL   EOS (ABSOLUTE) 0.1 0.0 - 0.4 x10E3/uL   Basophils Absolute 0.0 0.0 - 0.2 x10E3/uL   Immature Granulocytes 0 Not Estab. %   Immature Grans (Abs) 0.0 0.0 - 0.1 x10E3/uL  CMP14+EGFR  Result Value Ref Range   Glucose 76 65 - 99 mg/dL   BUN 13 6 - 24 mg/dL   Creatinine, Ser 0.64 0.57 - 1.00 mg/dL   GFR calc non Af Amer 100 >59 mL/min/1.73   GFR calc Af Amer 115 >59 mL/min/1.73   BUN/Creatinine Ratio 20 9 - 23   Sodium 144 134 - 144 mmol/L   Potassium 3.8 3.5 - 5.2 mmol/L   Chloride 106 96 - 106 mmol/L   CO2 23 20 - 29 mmol/L   Calcium 9.3 8.7 - 10.2 mg/dL   Total Protein 6.4 6.0 - 8.5 g/dL   Albumin 4.5 3.5 - 5.5 g/dL   Globulin, Total 1.9 1.5 - 4.5 g/dL   Albumin/Globulin Ratio 2.4 (H) 1.2 - 2.2   Bilirubin Total 0.4 0.0 - 1.2 mg/dL   Alkaline Phosphatase 72 39 - 117 IU/L   AST 19 0 - 40 IU/L   ALT 13 0 - 32 IU/L  Lipid panel  Result Value Ref Range   Cholesterol, Total 178 100 - 199 mg/dL   Triglycerides 54 0 - 149 mg/dL   HDL 87 >39 mg/dL   VLDL Cholesterol Cal 11 5 - 40 mg/dL   LDL Calculated 80 0 - 99 mg/dL   Chol/HDL Ratio 2.0 0.0 - 4.4 ratio  TSH  Result Value Ref Range   TSH 2.030 0.450 - 4.500 uIU/mL      Assessment & Plan:   1. Well adult exam - CBC with  Differential/Platelet -  CMP14+EGFR - Lipid panel - TSH  2. Need for immunization against influenza - Flu Vaccine QUAD 36+ mos IM   Continue all other maintenance medications as listed above.  Follow up plan: No follow-ups on file.  Educational handout given for [place] PA-C [place] [address]  [place], [place] [address] [phone]   08/13/2018, 4:52 PM

## 2018-08-15 ENCOUNTER — Encounter: Payer: BLUE CROSS/BLUE SHIELD | Admitting: Physician Assistant

## 2018-09-07 ENCOUNTER — Other Ambulatory Visit: Payer: Self-pay | Admitting: Physician Assistant

## 2018-09-26 ENCOUNTER — Other Ambulatory Visit: Payer: Self-pay | Admitting: Physician Assistant

## 2018-09-26 ENCOUNTER — Other Ambulatory Visit: Payer: Self-pay | Admitting: Obstetrics & Gynecology

## 2018-09-26 DIAGNOSIS — Z1231 Encounter for screening mammogram for malignant neoplasm of breast: Secondary | ICD-10-CM

## 2018-10-28 ENCOUNTER — Telehealth: Payer: Self-pay | Admitting: Physician Assistant

## 2018-10-28 NOTE — Telephone Encounter (Signed)
PT is wanting to  Know when her last dexa was and when she needs her next one?

## 2018-10-28 NOTE — Telephone Encounter (Signed)
Last dexa done 03/2017 will be due in 03/2019

## 2018-11-15 ENCOUNTER — Other Ambulatory Visit: Payer: Self-pay | Admitting: Physician Assistant

## 2018-11-15 DIAGNOSIS — Z780 Asymptomatic menopausal state: Secondary | ICD-10-CM

## 2018-12-01 ENCOUNTER — Ambulatory Visit
Admission: RE | Admit: 2018-12-01 | Discharge: 2018-12-01 | Disposition: A | Discharge status: Home / Self Care | Payer: Managed Care, Other (non HMO) | Source: Ambulatory Visit | Attending: Physician Assistant | Admitting: Physician Assistant

## 2018-12-01 ENCOUNTER — Other Ambulatory Visit: Payer: Self-pay

## 2018-12-01 DIAGNOSIS — Z1231 Encounter for screening mammogram for malignant neoplasm of breast: Secondary | ICD-10-CM

## 2019-02-23 ENCOUNTER — Other Ambulatory Visit: Payer: Self-pay

## 2019-02-24 ENCOUNTER — Ambulatory Visit (INDEPENDENT_AMBULATORY_CARE_PROVIDER_SITE_OTHER): Payer: Managed Care, Other (non HMO) | Admitting: Physician Assistant

## 2019-02-24 ENCOUNTER — Encounter: Payer: Self-pay | Admitting: Physician Assistant

## 2019-02-24 VITALS — BP 107/73 | HR 61 | Temp 98.2°F | Ht 62.0 in | Wt 161.6 lb

## 2019-02-24 DIAGNOSIS — M810 Age-related osteoporosis without current pathological fracture: Secondary | ICD-10-CM | POA: Diagnosis not present

## 2019-02-24 DIAGNOSIS — B029 Zoster without complications: Secondary | ICD-10-CM | POA: Diagnosis not present

## 2019-02-24 DIAGNOSIS — E7800 Pure hypercholesterolemia, unspecified: Secondary | ICD-10-CM

## 2019-02-24 DIAGNOSIS — E200 Idiopathic hypoparathyroidism: Secondary | ICD-10-CM | POA: Diagnosis not present

## 2019-02-24 LAB — CMP14+EGFR
ALT: 13 IU/L (ref 0–32)
AST: 18 IU/L (ref 0–40)
Albumin/Globulin Ratio: 3.2 — ABNORMAL HIGH (ref 1.2–2.2)
Albumin: 4.8 g/dL (ref 3.8–4.9)
Alkaline Phosphatase: 67 IU/L (ref 39–117)
BUN/Creatinine Ratio: 30 — ABNORMAL HIGH (ref 9–23)
BUN: 20 mg/dL (ref 6–24)
Bilirubin Total: 0.4 mg/dL (ref 0.0–1.2)
CO2: 23 mmol/L (ref 20–29)
Calcium: 9.5 mg/dL (ref 8.7–10.2)
Chloride: 106 mmol/L (ref 96–106)
Creatinine, Ser: 0.66 mg/dL (ref 0.57–1.00)
GFR calc Af Amer: 113 mL/min/{1.73_m2} (ref >59)
GFR calc non Af Amer: 98 mL/min/{1.73_m2} (ref >59)
Globulin, Total: 1.5 g/dL (ref 1.5–4.5)
Glucose: 88 mg/dL (ref 65–99)
Potassium: 4.4 mmol/L (ref 3.5–5.2)
Sodium: 143 mmol/L (ref 134–144)
Total Protein: 6.3 g/dL (ref 6.0–8.5)

## 2019-02-24 MED ORDER — ALENDRONATE SODIUM 70 MG PO TABS: 70.0000 mg | ORAL_TABLET | ORAL | 12 refills | Status: DC

## 2019-02-24 MED ORDER — ATORVASTATIN CALCIUM 10 MG PO TABS: 10.0000 mg | ORAL_TABLET | Freq: Every day | ORAL | 3 refills | Status: DC

## 2019-02-24 MED ORDER — VALACYCLOVIR HCL 500 MG PO TABS: ORAL_TABLET | ORAL | 0 refills | Status: DC

## 2019-03-01 NOTE — Progress Notes (Signed)
BP 107/73   Pulse 61   Temp 98.2 F (36.8 C) (Oral)   Ht 5' 2" (1.575 m)   Wt 161 lb 9.6 oz (73.3 kg)   LMP 10/22/2014   BMI 29.56 kg/m    Subjective:    Patient ID: Erika Ray, female    DOB: Oct 09, 1960, 58 y.o.   MRN: [id number]  HPI: Erika Ray is a 58 y.o. female presenting on 02/24/2019 for Medical Management of Chronic Issues (refills and lab work)  This patient comes in for periodic recheck on her chronic medical conditions they do include, elevated LDL, hyperparathyroidism.  She is followed by endocrinology.  She has labs performed today.  She is due for a DEXA scan next month.  She is continue to take Fosamax at this time.  Labs will be performed to evaluate her cholesterol.  The patient reports that she is doing really well with all the new orthopedic issues.  She is working and doing things at home.  Is overall not having any difficulties.  Past Medical History:  Diagnosis Date  . Broken arm 08/1966   left  . Rectal bleeding   . Rectal pain   . Thrombosed hemorrhoids    Relevant past medical, surgical, family and social history reviewed and updated as indicated. Interim medical history since our last visit reviewed. Allergies and medications reviewed and updated. DATA REVIEWED: CHART IN EPIC  Family History reviewed for pertinent findings.  Review of Systems  Constitutional: Negative.   HENT: Negative.   Eyes: Negative.   Respiratory: Negative.   Gastrointestinal: Negative.   Genitourinary: Negative.     Allergies as of 02/24/2019      Reactions   Ampicillin Rash   Penicillins Hives   All over the body, except the face.      Medication List       Accurate as of February 24, 2019 11:59 PM. If you have any questions, ask your nurse or doctor.        STOP taking these medications   levocetirizine 5 MG tablet Commonly known as:  Xyzal Stopped by:  Terald Sleeper, PA-C     TAKE these medications   alendronate 70 MG tablet Commonly known as:   FOSAMAX Take 1 tablet (70 mg total) by mouth every 7 (seven) days. Take with a full glass of water on an empty stomach.   atorvastatin 10 MG tablet Commonly known as:  LIPITOR Take 1 tablet (10 mg total) by mouth daily.   naproxen 500 MG tablet Commonly known as:  Naprosyn Take 1 tablet (500 mg total) by mouth 2 (two) times daily with a meal.   OVER THE COUNTER MEDICATION Vitafusion 1000units   valACYclovir 500 MG tablet Commonly known as:  VALTREX 1 PO 3x a day X7 days          Objective:    BP 107/73   Pulse 61   Temp 98.2 F (36.8 C) (Oral)   Ht 5' 2" (1.575 m)   Wt 161 lb 9.6 oz (73.3 kg)   LMP 10/22/2014   BMI 29.56 kg/m   Allergies  Allergen Reactions  . Ampicillin Rash  . Penicillins Hives    All over the body, except the face.    Wt Readings from Last 3 Encounters:  02/24/19 161 lb 9.6 oz (73.3 kg)  08/12/18 160 lb 3.2 oz (72.7 kg)  01/31/18 156 lb 6 oz (70.9 kg)    Physical Exam Constitutional:  Appearance: She is well-developed.  HENT:     Head: Normocephalic and atraumatic.  Eyes:     Conjunctiva/sclera: Conjunctivae normal.     Pupils: Pupils are equal, round, and reactive to light.  Cardiovascular:     Rate and Rhythm: Normal rate and regular rhythm.     Heart sounds: Normal heart sounds.  Pulmonary:     Effort: Pulmonary effort is normal.     Breath sounds: Normal breath sounds.  Abdominal:     General: Bowel sounds are normal.     Palpations: Abdomen is soft.  Skin:    General: Skin is warm and dry.     Findings: No rash.  Neurological:     Mental Status: She is alert and oriented to person, place, and time.     Deep Tendon Reflexes: Reflexes are normal and symmetric.  Psychiatric:        Behavior: Behavior normal.        Thought Content: Thought content normal.        Judgment: Judgment normal.     Results for orders placed or performed in visit on 02/24/19  CMP14+EGFR  Result Value Ref Range   Glucose 88 65 - 99 mg/dL    BUN 20 6 - 24 mg/dL   Creatinine, Ser 0.66 0.57 - 1.00 mg/dL   GFR calc non Af Amer 98 >59 mL/min/1.73   GFR calc Af Amer 113 >59 mL/min/1.73   BUN/Creatinine Ratio 30 (H) 9 - 23   Sodium 143 134 - 144 mmol/L   Potassium 4.4 3.5 - 5.2 mmol/L   Chloride 106 96 - 106 mmol/L   CO2 23 20 - 29 mmol/L   Calcium 9.5 8.7 - 10.2 mg/dL   Total Protein 6.3 6.0 - 8.5 g/dL   Albumin 4.8 3.8 - 4.9 g/dL   Globulin, Total 1.5 1.5 - 4.5 g/dL   Albumin/Globulin Ratio 3.2 (H) 1.2 - 2.2   Bilirubin Total 0.4 0.0 - 1.2 mg/dL   Alkaline Phosphatase 67 39 - 117 IU/L   AST 18 0 - 40 IU/L   ALT 13 0 - 32 IU/L      Assessment & Plan:   1. Herpes zoster without complication - valACYclovir (VALTREX) 500 MG tablet; 1 PO 3x a day X7 days  Dispense: 21 tablet; Refill: 0  2. Idiopathic hypoparathyroidism (HCC) - CMP14+EGFR  3. Age related osteoporosis, unspecified pathological fracture presence - alendronate (FOSAMAX) 70 MG tablet; Take 1 tablet (70 mg total) by mouth every 7 (seven) days. Take with a full glass of water on an empty stomach.  Dispense: 4 tablet; Refill: 12  4. Elevated LDL cholesterol level - atorvastatin (LIPITOR) 10 MG tablet; Take 1 tablet (10 mg total) by mouth daily.  Dispense: 90 tablet; Refill: 3   Continue all other maintenance medications as listed above.  Follow up plan: Return in about 6 months (around 08/26/2019).  Educational handout given for survey  Angel S. Jones PA-C [facility] [address]  [place], [place] [address] [phone]   03/01/2019, 1:54 PM  

## 2019-03-02 ENCOUNTER — Encounter: Payer: Self-pay | Admitting: Physician Assistant

## 2019-03-20 ENCOUNTER — Ambulatory Visit: Payer: Managed Care, Other (non HMO) | Admitting: Physician Assistant

## 2019-03-31 ENCOUNTER — Other Ambulatory Visit: Payer: Managed Care, Other (non HMO)

## 2019-04-27 ENCOUNTER — Telehealth: Payer: Self-pay | Admitting: Physician Assistant

## 2019-04-27 NOTE — Telephone Encounter (Signed)
Pt needs to be evaluated.

## 2019-04-27 NOTE — Telephone Encounter (Signed)
Pt aware appt made

## 2019-04-28 ENCOUNTER — Other Ambulatory Visit: Payer: Managed Care, Other (non HMO)

## 2019-04-28 ENCOUNTER — Ambulatory Visit: Payer: Managed Care, Other (non HMO) | Admitting: Physician Assistant

## 2019-04-28 ENCOUNTER — Encounter: Payer: Self-pay | Admitting: Physician Assistant

## 2019-04-28 ENCOUNTER — Other Ambulatory Visit: Payer: Self-pay

## 2019-04-28 VITALS — BP 125/85 | HR 65 | Temp 97.1°F | Ht 62.0 in | Wt 159.0 lb

## 2019-04-28 DIAGNOSIS — G8929 Other chronic pain: Secondary | ICD-10-CM | POA: Diagnosis not present

## 2019-04-28 DIAGNOSIS — M546 Pain in thoracic spine: Secondary | ICD-10-CM

## 2019-04-28 MED ORDER — CYCLOBENZAPRINE HCL 10 MG PO TABS: 10.0000 mg | ORAL_TABLET | Freq: Three times a day (TID) | ORAL | 0 refills | Status: DC | PRN

## 2019-04-28 MED ORDER — KETOROLAC TROMETHAMINE 60 MG/2ML IM SOLN
60.0000 mg | Freq: Once | INTRAMUSCULAR | Status: AC
  Administered 2019-04-28: 60 mg via INTRAMUSCULAR

## 2019-04-28 MED ORDER — METHYLPREDNISOLONE ACETATE 80 MG/ML IJ SUSP
80.0000 mg | Freq: Once | INTRAMUSCULAR | Status: AC
  Administered 2019-04-28: 80 mg via INTRAMUSCULAR

## 2019-04-28 NOTE — Patient Instructions (Signed)

## 2019-04-30 NOTE — Progress Notes (Signed)
BP 125/85   Pulse 65   Temp (!) 97.1 F (36.2 C) (Temporal)   Ht 5' 2"  (1.575 m)   Wt 159 lb (72.1 kg)   LMP 10/22/2014   BMI 29.08 kg/m    Subjective:    Patient ID: Erika Ray, female    DOB: 1961-05-18, 58 y.o.   MRN: [id number]  HPI: Erika Ray is a 58 y.o. female presenting on 04/28/2019 for Back Pain  This patient comes in for recurrence of lower thoracic upper lumbar pain.  She did have an injury about 13 years ago.  And does have recurrence of the pain.  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  She is just found out this week that work is going to be closing in a few months.  She has taken only Tylenol for the treatment she has taken spasm medication in the past.  And has tolerated it well.  Past Medical History:  Diagnosis Date  . Broken arm 08/1966   left  . Rectal bleeding   . Rectal pain   . Thrombosed hemorrhoids    Relevant past medical, surgical, family and social history reviewed and updated as indicated. Interim medical history since our last visit reviewed. Allergies and medications reviewed and updated. DATA REVIEWED: CHART IN EPIC  Family History reviewed for pertinent findings.  Review of Systems  Constitutional: Negative.  Negative for activity change, fatigue and fever.  HENT: Negative.   Eyes: Negative.   Respiratory: Negative.  Negative for cough.   Cardiovascular: Negative.  Negative for chest pain.  Gastrointestinal: Negative.  Negative for abdominal pain.  Endocrine: Negative.   Genitourinary: Negative.  Negative for dysuria.  Musculoskeletal: Positive for arthralgias and back pain.  Skin: Negative.   Neurological: Negative.     Allergies as of 04/28/2019      Reactions   Ampicillin Rash   Penicillins Hives   All over the body, except the face.      Medication List       Accurate as of April 28, 2019 11:59 PM. If you have any questions, ask your nurse or doctor.        alendronate 70 MG tablet Commonly known as: FOSAMAX  Take 1 tablet (70 mg total) by mouth every 7 (seven) days. Take with a full glass of water on an empty stomach.   atorvastatin 10 MG tablet Commonly known as: LIPITOR Take 1 tablet (10 mg total) by mouth daily.   cyclobenzaprine 10 MG tablet Commonly known as: FLEXERIL Take 1 tablet (10 mg total) by mouth 3 (three) times daily as needed for muscle spasms. Started by: Terald Sleeper, PA-C   naproxen 500 MG tablet Commonly known as: Naprosyn Take 1 tablet (500 mg total) by mouth 2 (two) times daily with a meal.   OVER THE COUNTER MEDICATION Vitafusion 1000units   valACYclovir 500 MG tablet Commonly known as: VALTREX 1 PO 3x a day X7 days          Objective:    BP 125/85   Pulse 65   Temp (!) 97.1 F (36.2 C) (Temporal)   Ht 5' 2"  (1.575 m)   Wt 159 lb (72.1 kg)   LMP 10/22/2014   BMI 29.08 kg/m   Allergies  Allergen Reactions  . Ampicillin Rash  . Penicillins Hives    All over the body, except the face.    Wt Readings from Last 3 Encounters:  04/28/19 159 lb (72.1 kg)  02/24/19 161  lb 9.6 oz (73.3 kg)  08/12/18 160 lb 3.2 oz (72.7 kg)    Physical Exam Constitutional:      Appearance: She is well-developed.  HENT:     Head: Normocephalic and atraumatic.  Eyes:     Conjunctiva/sclera: Conjunctivae normal.     Pupils: Pupils are equal, round, and reactive to light.  Cardiovascular:     Rate and Rhythm: Normal rate and regular rhythm.     Heart sounds: Normal heart sounds.  Pulmonary:     Effort: Pulmonary effort is normal.     Breath sounds: Normal breath sounds.  Abdominal:     General: Bowel sounds are normal.     Palpations: Abdomen is soft.  Musculoskeletal:     Thoracic back: She exhibits decreased range of motion, tenderness, pain and spasm.       Back:  Skin:    General: Skin is warm and dry.     Findings: No rash.  Neurological:     Mental Status: She is alert and oriented to person, place, and time.     Deep Tendon Reflexes: Reflexes are  normal and symmetric.  Psychiatric:        Behavior: Behavior normal.        Thought Content: Thought content normal.        Judgment: Judgment normal.     Results for orders placed or performed in visit on 02/24/19  CMP14+EGFR  Result Value Ref Range   Glucose 88 65 - 99 mg/dL   BUN 20 6 - 24 mg/dL   Creatinine, Ser 0.66 0.57 - 1.00 mg/dL   GFR calc non Af Amer 98 >59 mL/min/1.73   GFR calc Af Amer 113 >59 mL/min/1.73   BUN/Creatinine Ratio 30 (H) 9 - 23   Sodium 143 134 - 144 mmol/L   Potassium 4.4 3.5 - 5.2 mmol/L   Chloride 106 96 - 106 mmol/L   CO2 23 20 - 29 mmol/L   Calcium 9.5 8.7 - 10.2 mg/dL   Total Protein 6.3 6.0 - 8.5 g/dL   Albumin 4.8 3.8 - 4.9 g/dL   Globulin, Total 1.5 1.5 - 4.5 g/dL   Albumin/Globulin Ratio 3.2 (H) 1.2 - 2.2   Bilirubin Total 0.4 0.0 - 1.2 mg/dL   Alkaline Phosphatase 67 39 - 117 IU/L   AST 18 0 - 40 IU/L   ALT 13 0 - 32 IU/L      Assessment & Plan:   1. Chronic midline thoracic back pain - ketorolac (TORADOL) injection 60 mg - methylPREDNISolone acetate (DEPO-MEDROL) injection 80 mg - cyclobenzaprine (FLEXERIL) 10 MG tablet; Take 1 tablet (10 mg total) by mouth 3 (three) times daily as needed for muscle spasms.  Dispense: 30 tablet; Refill: 0   Continue all other maintenance medications as listed above.  Follow up plan: No follow-ups on file.  Educational handout given for [place] PA-C [place] [address]  [place], [place] [address] [phone]   04/30/2019, 9:45 PM

## 2019-05-12 ENCOUNTER — Other Ambulatory Visit: Payer: Self-pay | Admitting: *Deleted

## 2019-05-12 ENCOUNTER — Telehealth: Payer: Self-pay | Admitting: Physician Assistant

## 2019-05-12 DIAGNOSIS — M810 Age-related osteoporosis without current pathological fracture: Secondary | ICD-10-CM

## 2019-05-12 NOTE — Telephone Encounter (Signed)
Order placed to have done at [facility].  Message sent to Referral dept

## 2019-05-12 NOTE — Telephone Encounter (Signed)
Can you check to know if it will be anytime in September?  And if not okay to do order for DEXA at [facility]

## 2019-05-15 ENCOUNTER — Telehealth: Payer: Self-pay | Admitting: Physician Assistant

## 2019-05-15 NOTE — Telephone Encounter (Signed)
Order sent to Dr. Chalmers Cater at [facility].

## 2019-05-30 ENCOUNTER — Telehealth: Payer: Self-pay | Admitting: Physician Assistant

## 2019-05-31 ENCOUNTER — Other Ambulatory Visit: Payer: Self-pay | Admitting: *Deleted

## 2019-05-31 DIAGNOSIS — M810 Age-related osteoporosis without current pathological fracture: Secondary | ICD-10-CM

## 2019-05-31 MED ORDER — ALENDRONATE SODIUM 70 MG PO TABS: 70.0000 mg | ORAL_TABLET | ORAL | 3 refills | Status: DC

## 2019-06-01 NOTE — Telephone Encounter (Signed)
Spoke with Patient - She is aware there is an order in Epic for this exam - She is going to call [facility] and schedule herself.

## 2019-06-12 ENCOUNTER — Other Ambulatory Visit: Payer: Self-pay | Admitting: Physician Assistant

## 2019-06-12 DIAGNOSIS — B029 Zoster without complications: Secondary | ICD-10-CM

## 2019-06-19 ENCOUNTER — Other Ambulatory Visit: Payer: Self-pay

## 2019-06-19 ENCOUNTER — Ambulatory Visit (HOSPITAL_COMMUNITY)
Admission: RE | Admit: 2019-06-19 | Discharge: 2019-06-19 | Disposition: A | Discharge status: Home / Self Care | Payer: Managed Care, Other (non HMO) | Source: Ambulatory Visit | Attending: Physician Assistant | Admitting: Physician Assistant

## 2019-06-19 ENCOUNTER — Telehealth: Payer: Self-pay | Admitting: Physician Assistant

## 2019-06-19 DIAGNOSIS — M810 Age-related osteoporosis without current pathological fracture: Secondary | ICD-10-CM | POA: Insufficient documentation

## 2019-06-19 NOTE — Telephone Encounter (Signed)
Continue fosamax, you are maintaining well

## 2019-06-19 NOTE — Telephone Encounter (Signed)
Patient aware of dexa results and recommendations.

## 2019-06-19 NOTE — Telephone Encounter (Signed)
Patient aware.

## 2019-06-19 NOTE — Telephone Encounter (Signed)
Patient would like to know if she needs to stay on Fosamax or switch to injection

## 2019-07-28 ENCOUNTER — Telehealth: Payer: Self-pay | Admitting: Physician Assistant

## 2019-07-28 NOTE — Telephone Encounter (Signed)
appt made

## 2019-07-28 NOTE — Telephone Encounter (Signed)
lmtcb

## 2019-08-31 ENCOUNTER — Other Ambulatory Visit: Payer: Self-pay

## 2019-09-01 ENCOUNTER — Ambulatory Visit: Payer: Managed Care, Other (non HMO) | Admitting: Physician Assistant

## 2019-09-01 ENCOUNTER — Encounter: Payer: Managed Care, Other (non HMO) | Admitting: Physician Assistant

## 2019-09-01 ENCOUNTER — Encounter: Payer: Self-pay | Admitting: Physician Assistant

## 2019-09-01 VITALS — BP 112/79 | HR 75 | Temp 99.0°F | Resp 20 | Ht 62.0 in | Wt 160.0 lb

## 2019-09-01 DIAGNOSIS — M546 Pain in thoracic spine: Secondary | ICD-10-CM

## 2019-09-01 DIAGNOSIS — E200 Idiopathic hypoparathyroidism: Secondary | ICD-10-CM | POA: Diagnosis not present

## 2019-09-01 DIAGNOSIS — B029 Zoster without complications: Secondary | ICD-10-CM

## 2019-09-01 DIAGNOSIS — E7800 Pure hypercholesterolemia, unspecified: Secondary | ICD-10-CM | POA: Diagnosis not present

## 2019-09-01 DIAGNOSIS — M810 Age-related osteoporosis without current pathological fracture: Secondary | ICD-10-CM

## 2019-09-01 DIAGNOSIS — G8929 Other chronic pain: Secondary | ICD-10-CM

## 2019-09-01 MED ORDER — CYCLOBENZAPRINE HCL 10 MG PO TABS: 10.0000 mg | ORAL_TABLET | Freq: Three times a day (TID) | ORAL | 2 refills | Status: DC | PRN

## 2019-09-01 MED ORDER — ALENDRONATE SODIUM 70 MG PO TABS: 70.0000 mg | ORAL_TABLET | ORAL | 3 refills | Status: DC

## 2019-09-01 MED ORDER — VALACYCLOVIR HCL 500 MG PO TABS: ORAL_TABLET | ORAL | 11 refills | Status: DC

## 2019-09-02 LAB — LIPID PANEL
Chol/HDL Ratio: 2.1 ratio (ref 0.0–4.4)
Cholesterol, Total: 176 mg/dL (ref 100–199)
HDL: 84 mg/dL (ref >39)
LDL Chol Calc (NIH): 80 mg/dL (ref 0–99)
Triglycerides: 65 mg/dL (ref 0–149)
VLDL Cholesterol Cal: 12 mg/dL (ref 5–40)

## 2019-09-02 LAB — CBC WITH DIFFERENTIAL/PLATELET
Basophils Absolute: 0.1 10*3/uL (ref 0.0–0.2)
Basos: 1 %
EOS (ABSOLUTE): 0.2 10*3/uL (ref 0.0–0.4)
Eos: 2 %
Hematocrit: 38.4 % (ref 34.0–46.6)
Hemoglobin: 13.3 g/dL (ref 11.1–15.9)
Immature Grans (Abs): 0 10*3/uL (ref 0.0–0.1)
Immature Granulocytes: 0 %
Lymphocytes Absolute: 2.4 10*3/uL (ref 0.7–3.1)
Lymphs: 29 %
MCH: 31.1 pg (ref 26.6–33.0)
MCHC: 34.6 g/dL (ref 31.5–35.7)
MCV: 90 fL (ref 79–97)
Monocytes Absolute: 0.7 10*3/uL (ref 0.1–0.9)
Monocytes: 8 %
Neutrophils Absolute: 4.8 10*3/uL (ref 1.4–7.0)
Neutrophils: 60 %
Platelets: 241 10*3/uL (ref 150–450)
RBC: 4.28 x10E6/uL (ref 3.77–5.28)
RDW: 12.3 % (ref 11.7–15.4)
WBC: 8.1 10*3/uL (ref 3.4–10.8)

## 2019-09-02 LAB — CMP14+EGFR
ALT: 12 IU/L (ref 0–32)
AST: 15 IU/L (ref 0–40)
Albumin/Globulin Ratio: 2.1 (ref 1.2–2.2)
Albumin: 4.6 g/dL (ref 3.8–4.9)
Alkaline Phosphatase: 79 IU/L (ref 39–117)
BUN/Creatinine Ratio: 16 (ref 9–23)
BUN: 11 mg/dL (ref 6–24)
Bilirubin Total: 0.5 mg/dL (ref 0.0–1.2)
CO2: 26 mmol/L (ref 20–29)
Calcium: 9.3 mg/dL (ref 8.7–10.2)
Chloride: 102 mmol/L (ref 96–106)
Creatinine, Ser: 0.7 mg/dL (ref 0.57–1.00)
GFR calc Af Amer: 111 mL/min/{1.73_m2} (ref >59)
GFR calc non Af Amer: 96 mL/min/{1.73_m2} (ref >59)
Globulin, Total: 2.2 g/dL (ref 1.5–4.5)
Glucose: 76 mg/dL (ref 65–99)
Potassium: 3.9 mmol/L (ref 3.5–5.2)
Sodium: 140 mmol/L (ref 134–144)
Total Protein: 6.8 g/dL (ref 6.0–8.5)

## 2019-09-02 LAB — THYROID PANEL WITH TSH
Free Thyroxine Index: 1.6 (ref 1.2–4.9)
T3 Uptake Ratio: 24 % (ref 24–39)
T4, Total: 6.7 ug/dL (ref 4.5–12.0)
TSH: 2.39 u[IU]/mL (ref 0.450–4.500)

## 2019-09-03 NOTE — Progress Notes (Signed)
Acute Office Visit  Subjective:    Patient ID: Erika Ray, female    DOB: 12-03-1960, 58 y.o.   MRN: [id number]  Chief Complaint  Patient presents with  . Medical Management of Chronic Issues  . Hyperlipidemia    Hyperlipidemia This is a chronic problem. The current episode started more than 1 year ago. The problem is controlled. Recent lipid tests were reviewed and are normal. Pertinent negatives include no chest pain. Current antihyperlipidemic treatment includes statins. The current treatment provides significant improvement of lipids. There are no compliance problems.  Risk factors for coronary artery disease include dyslipidemia.   Osteoporosis The patient was diagnosed with osteoporosis a couple years ago.  In 2018 her T score was -1.8.  She just recently had it checked is -2.5.  She is definitively in osteoporosis.  The reason she had the DEXA scan performed 2 years ago is because she had a spontaneous fracture and the orthopedist was concerned.  She is under the care of endocrinology.  Otherwise the patient reports that she is doing very well not have any other difficulties.  All of her medications are reviewed.  Past Medical History:  Diagnosis Date  . Broken arm 08/1966   left  . Rectal bleeding   . Rectal pain   . Thrombosed hemorrhoids     Past Surgical History:  Procedure Laterality Date  . FRACTURE SURGERY  1967   left arm  . HEMORROIDECTOMY  06/11/11   thromb hems    Family History  Problem Relation Age of Onset  . Cancer Mother 62       ovarian  . Heart disease Father 98  . COPD Father   . Cancer Maternal Grandmother 30       breast  . Thyroid disease Maternal Grandmother   . Thyroid disease Maternal Aunt   . Thyroid disease Maternal Uncle     Social History   Socioeconomic History  . Marital status: Married    Spouse name: Not on file  . Number of children: Not on file  . Years of education: Not on file  . Highest education level: Not on  file  Occupational History  . Not on file  Tobacco Use  . Smoking status: Never Smoker  . Smokeless tobacco: Never Used  Substance and Sexual Activity  . Alcohol use: No  . Drug use: No  . Sexual activity: Not on file  Other Topics Concern  . Not on file  Social History Narrative  . Not on file   Social Determinants of Health   Financial Resource Strain:   . Difficulty of Paying Living Expenses: Not on file  Food Insecurity:   . Worried About Charity fundraiser in the Last Year: Not on file  . Ran Out of Food in the Last Year: Not on file  Transportation Needs:   . Lack of Transportation (Medical): Not on file  . Lack of Transportation (Non-Medical): Not on file  Physical Activity:   . Days of Exercise per Week: Not on file  . Minutes of Exercise per Session: Not on file  Stress:   . Feeling of Stress : Not on file  Social Connections:   . Frequency of Communication with Friends and Family: Not on file  . Frequency of Social Gatherings with Friends and Family: Not on file  . Attends Religious Services: Not on file  . Active Member of Clubs or Organizations: Not on file  . Attends Archivist Meetings:  Not on file  . Marital Status: Not on file  Intimate Partner Violence:   . Fear of Current or Ex-Partner: Not on file  . Emotionally Abused: Not on file  . Physically Abused: Not on file  . Sexually Abused: Not on file    Outpatient Medications Prior to Visit  Medication Sig Dispense Refill  . atorvastatin (LIPITOR) 10 MG tablet Take 1 tablet (10 mg total) by mouth daily. 90 tablet 3  . OVER THE COUNTER MEDICATION Vitafusion 1000units    . alendronate (FOSAMAX) 70 MG tablet Take 1 tablet (70 mg total) by mouth every 7 (seven) days. Take with a full glass of water on an empty stomach. 12 tablet 3  . cyclobenzaprine (FLEXERIL) 10 MG tablet Take 1 tablet (10 mg total) by mouth 3 (three) times daily as needed for muscle spasms. 30 tablet 0  . valACYclovir  (VALTREX) 500 MG tablet TAKE 1 TABLET 3 TIMES A DAY FOR 7 DAYS 21 tablet 0  . naproxen (NAPROSYN) 500 MG tablet Take 1 tablet (500 mg total) by mouth 2 (two) times daily with a meal. 60 tablet 2   No facility-administered medications prior to visit.    Allergies  Allergen Reactions  . Ampicillin Rash  . Penicillins Hives    All over the body, except the face.    Review of Systems  Constitutional: Negative.   HENT: Negative.   Eyes: Negative.   Respiratory: Negative.   Cardiovascular: Negative for chest pain.  Gastrointestinal: Negative.   Genitourinary: Negative.        Objective:    Physical Exam Constitutional:      Appearance: She is well-developed.  HENT:     Head: Normocephalic and atraumatic.     Right Ear: Tympanic membrane, ear canal and external ear normal.     Left Ear: Tympanic membrane, ear canal and external ear normal.     Nose: Nose normal. No rhinorrhea.     Mouth/Throat:     Pharynx: No oropharyngeal exudate or posterior oropharyngeal erythema.  Eyes:     Conjunctiva/sclera: Conjunctivae normal.     Pupils: Pupils are equal, round, and reactive to light.  Cardiovascular:     Rate and Rhythm: Normal rate and regular rhythm.     Heart sounds: Normal heart sounds.  Pulmonary:     Effort: Pulmonary effort is normal.     Breath sounds: Normal breath sounds.  Abdominal:     General: Bowel sounds are normal.     Palpations: Abdomen is soft.  Musculoskeletal:     Cervical back: Normal range of motion and neck supple.  Skin:    General: Skin is warm and dry.     Findings: No rash.  Neurological:     Mental Status: She is alert and oriented to person, place, and time.     Deep Tendon Reflexes: Reflexes are normal and symmetric.  Psychiatric:        Behavior: Behavior normal.        Thought Content: Thought content normal.        Judgment: Judgment normal.     BP 112/79   Pulse 75   Temp 99 F (37.2 C)   Resp 20   Ht 5' 2"  (1.575 m)   Wt 160  lb (72.6 kg)   LMP 10/22/2014   SpO2 97%   BMI 29.26 kg/m  Wt Readings from Last 3 Encounters:  09/01/19 160 lb (72.6 kg)  04/28/19 159 lb (72.1 kg)  02/24/19  161 lb 9.6 oz (73.3 kg)    Health Maintenance Due  Topic Date Due  . PAP SMEAR-Modifier  09/10/2019    There are no preventive care reminders to display for this patient.   Lab Results  Component Value Date   TSH 2.390 09/01/2019   Lab Results  Component Value Date   WBC 8.1 09/01/2019   HGB 13.3 09/01/2019   HCT 38.4 09/01/2019   MCV 90 09/01/2019   PLT 241 09/01/2019   Lab Results  Component Value Date   NA 140 09/01/2019   K 3.9 09/01/2019   CO2 26 09/01/2019   GLUCOSE 76 09/01/2019   BUN 11 09/01/2019   CREATININE 0.70 09/01/2019   BILITOT 0.5 09/01/2019   ALKPHOS 79 09/01/2019   AST 15 09/01/2019   ALT 12 09/01/2019   PROT 6.8 09/01/2019   ALBUMIN 4.6 09/01/2019   CALCIUM 9.3 09/01/2019   Lab Results  Component Value Date   CHOL 176 09/01/2019   Lab Results  Component Value Date   HDL 84 09/01/2019   Lab Results  Component Value Date   LDLCALC 80 09/01/2019   Lab Results  Component Value Date   TRIG 65 09/01/2019   Lab Results  Component Value Date   CHOLHDL 2.1 09/01/2019   No results found for: HGBA1C     Assessment & Plan:   Problem List Items Addressed This Visit      Endocrine   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   Relevant Orders   CBC with Differential/Platelet (Completed)   Thyroid Panel With TSH (Completed)     Musculoskeletal and Integument   Age related osteoporosis   Relevant Medications   alendronate (FOSAMAX) 70 MG tablet     Other   Herpes zoster without complication   Relevant Medications   valACYclovir (VALTREX) 500 MG tablet    Other Visit Diagnoses    Elevated LDL cholesterol level       Relevant Orders   CBC with Differential/Platelet (Completed)   CMP14+EGFR (Completed)   Lipid panel (Completed)   Chronic midline thoracic back  pain       Relevant Medications   cyclobenzaprine (FLEXERIL) 10 MG tablet       Meds ordered this encounter  Medications  . alendronate (FOSAMAX) 70 MG tablet    Sig: Take 1 tablet (70 mg total) by mouth every 7 (seven) days. Take with a full glass of water on an empty stomach.    Dispense:  12 tablet    Refill:  3    Order Specific Question:   Supervising Provider    Answer:   Janora Norlander [[id number]]  . cyclobenzaprine (FLEXERIL) 10 MG tablet    Sig: Take 1 tablet (10 mg total) by mouth 3 (three) times daily as needed for muscle spasms.    Dispense:  30 tablet    Refill:  2    Order Specific Question:   Supervising Provider    Answer:   Janora Norlander [[id number]]  . valACYclovir (VALTREX) 500 MG tablet    Sig: TAKE 1 TABLET 3 TIMES A DAY FOR 7 DAYS    Dispense:  21 tablet    Refill:  11    Order Specific Question:   Supervising Provider    Answer:   Janora Norlander [[id number]]     Terald Sleeper, PA-C

## 2019-09-06 ENCOUNTER — Telehealth: Payer: Self-pay | Admitting: Physician Assistant

## 2019-09-06 NOTE — Telephone Encounter (Signed)
Patient called back and said she received missed call from [place]. Requested call back when nurse was available.

## 2019-09-06 NOTE — Telephone Encounter (Signed)
Patient called stating that she received a missed call from someone here. Erika Ray it could be about a medication that she had previously called about. Wants nurse to call her back. (Alendronate Rx)

## 2019-09-06 NOTE — Telephone Encounter (Signed)
Patient aware and verbalized understanding. °

## 2019-09-06 NOTE — Telephone Encounter (Signed)
Lmtcb.

## 2019-09-06 NOTE — Telephone Encounter (Signed)
Patient states you where suppose to be getting back with her on her Alendronate Rx from the [facility].

## 2019-09-06 NOTE — Telephone Encounter (Signed)
I have not had opportunity to speak with another physician in office.

## 2019-09-12 ENCOUNTER — Other Ambulatory Visit: Payer: Self-pay | Admitting: Physician Assistant

## 2019-09-12 ENCOUNTER — Encounter: Payer: Self-pay | Admitting: Physician Assistant

## 2019-09-26 ENCOUNTER — Telehealth: Payer: Self-pay

## 2019-09-26 NOTE — Telephone Encounter (Signed)
Spoke to patient, she said that the Prolia injection was suppose to be ordered for her.  I send in notes where Erika Feeler, PA states that she was going to place order for the injection but I did not see the order or medication listed in the chart. Please advise if this has been ordered for the patient.

## 2019-10-16 ENCOUNTER — Telehealth: Payer: Self-pay | Admitting: Physician Assistant

## 2019-10-16 ENCOUNTER — Other Ambulatory Visit: Payer: Self-pay

## 2019-10-19 ENCOUNTER — Other Ambulatory Visit: Payer: Self-pay

## 2019-10-19 DIAGNOSIS — M810 Age-related osteoporosis without current pathological fracture: Secondary | ICD-10-CM

## 2019-10-19 MED ORDER — DENOSUMAB 60 MG/ML ~~LOC~~ SOSY: 60.0000 mg | PREFILLED_SYRINGE | Freq: Once | SUBCUTANEOUS | Status: DC

## 2019-10-19 MED ORDER — DENOSUMAB 60 MG/ML ~~LOC~~ SOSY: 60.0000 mg | PREFILLED_SYRINGE | Freq: Once | SUBCUTANEOUS | 0 refills | Status: AC

## 2019-10-21 ENCOUNTER — Encounter: Payer: Self-pay | Admitting: Physician Assistant

## 2019-10-24 NOTE — Telephone Encounter (Signed)
Sent for PA per patients insurance. See next telephone encounter

## 2019-10-24 NOTE — Telephone Encounter (Signed)
Sent through [employer] portal on 10/19/2019. Per [employer] must send in to patients local pharmacy. Sent to [facility] and they stated that she needed a PA. PA sent on 10/24/2019. Awaiting response

## 2019-10-30 ENCOUNTER — Other Ambulatory Visit: Payer: Self-pay | Admitting: Obstetrics and Gynecology

## 2019-10-30 DIAGNOSIS — Z1231 Encounter for screening mammogram for malignant neoplasm of breast: Secondary | ICD-10-CM

## 2019-11-07 ENCOUNTER — Encounter: Payer: Self-pay | Admitting: Physician Assistant

## 2019-11-10 ENCOUNTER — Telehealth: Payer: Self-pay | Admitting: Physician Assistant

## 2019-11-16 NOTE — Telephone Encounter (Signed)
Spoke with patient and explained to her that I have been trying to get benefit verification from her insurance company.  Patient reports she received a call from [facility] stating that the Prolia has been approved by her insurance company.  I will contact the rep, Trey Paula, with [employer] to verify and get back with patient with more information as soon as I have it.

## 2019-11-17 NOTE — Telephone Encounter (Signed)
Spoke with Prolia rep, Merry Proud, who explained that patient has a large deductible that would need to be met before patient could receive injection through our office under buy & bill.  I then spoke with Stu at [facility] who told me that they received the prior approval for Prolia but that when he tries to run it through it is denied.  He contacted the patient's insurance company who told him it is not covered under her pharmacy benefit.  It would need to go through her medical benefit (large deductible).  I relayed this information back to the Prolia rep who suggested I email Sharlet Salina at [facility] ([email or web address]) and ask her to check the availability for Prolia through them.  I sent an email to [facility] and am awaiting reply.  I contacted the patient to give her an update.

## 2019-11-20 ENCOUNTER — Encounter: Payer: Self-pay | Admitting: Physician Assistant

## 2019-11-20 NOTE — Telephone Encounter (Signed)
Heard back from [place] at [facility].  This medication is not covered by patient's insurance plan and the claim was denied for pharmacy coverage.  Left message for patient to call me back.

## 2019-11-20 NOTE — Telephone Encounter (Signed)
Spoke with patient and explained to her that insurance will not pay for Prolia.  Patient voiced understanding.

## 2019-11-24 ENCOUNTER — Encounter: Payer: Self-pay | Admitting: Physician Assistant

## 2019-11-24 ENCOUNTER — Other Ambulatory Visit: Payer: Self-pay | Admitting: Physician Assistant

## 2019-11-24 DIAGNOSIS — M810 Age-related osteoporosis without current pathological fracture: Secondary | ICD-10-CM

## 2019-11-27 ENCOUNTER — Telehealth: Payer: Self-pay | Admitting: Physician Assistant

## 2019-12-05 ENCOUNTER — Ambulatory Visit
Admission: RE | Admit: 2019-12-05 | Discharge: 2019-12-05 | Disposition: A | Discharge status: Home / Self Care | Payer: 59 | Source: Ambulatory Visit | Attending: Obstetrics and Gynecology | Admitting: Obstetrics and Gynecology

## 2019-12-05 ENCOUNTER — Other Ambulatory Visit: Payer: Self-pay

## 2019-12-05 DIAGNOSIS — Z1231 Encounter for screening mammogram for malignant neoplasm of breast: Secondary | ICD-10-CM

## 2020-02-27 ENCOUNTER — Telehealth: Payer: Self-pay | Admitting: Family Medicine

## 2020-02-27 ENCOUNTER — Encounter: Payer: Managed Care, Other (non HMO) | Admitting: Family Medicine

## 2020-02-27 NOTE — Telephone Encounter (Signed)
Talked to the nurse to make appt

## 2020-03-22 ENCOUNTER — Other Ambulatory Visit: Payer: Self-pay

## 2020-03-22 ENCOUNTER — Ambulatory Visit (INDEPENDENT_AMBULATORY_CARE_PROVIDER_SITE_OTHER): Payer: Managed Care, Other (non HMO) | Admitting: Family Medicine

## 2020-03-22 ENCOUNTER — Encounter: Payer: Self-pay | Admitting: Family Medicine

## 2020-03-22 VITALS — BP 113/74 | HR 60 | Temp 97.6°F | Ht 62.0 in | Wt 165.0 lb

## 2020-03-22 DIAGNOSIS — E7800 Pure hypercholesterolemia, unspecified: Secondary | ICD-10-CM | POA: Diagnosis not present

## 2020-03-22 DIAGNOSIS — Z0001 Encounter for general adult medical examination with abnormal findings: Secondary | ICD-10-CM | POA: Diagnosis not present

## 2020-03-22 DIAGNOSIS — Z0000 Encounter for general adult medical examination without abnormal findings: Secondary | ICD-10-CM

## 2020-03-22 DIAGNOSIS — Z566 Other physical and mental strain related to work: Secondary | ICD-10-CM | POA: Diagnosis not present

## 2020-03-22 DIAGNOSIS — M810 Age-related osteoporosis without current pathological fracture: Secondary | ICD-10-CM | POA: Diagnosis not present

## 2020-03-22 MED ORDER — ATORVASTATIN CALCIUM 10 MG PO TABS: 10.0000 mg | ORAL_TABLET | Freq: Every day | ORAL | 3 refills | Status: DC

## 2020-03-22 MED ORDER — ALENDRONATE SODIUM 70 MG PO TABS: 70.0000 mg | ORAL_TABLET | ORAL | 3 refills | Status: DC

## 2020-03-22 NOTE — Patient Instructions (Signed)
You had labs performed today.  You will be contacted with the results of the labs once they are available, usually in the next 3 business days for routine lab work.  If you have an active my chart account, they will be released to your MyChart.  If you prefer to have these labs released to you via telephone, please let us know. ° °If you had a pap smear or biopsy performed, expect to be contacted in about 7-10 days. ° ° °Preventive Care 40-58 Years Old, Female °Preventive care refers to visits with your health care provider and lifestyle choices that can promote health and wellness. This includes: °· A yearly physical exam. This may also be called an annual well check. °· Regular dental visits and eye exams. °· Immunizations. °· Screening for certain conditions. °· Healthy lifestyle choices, such as eating a healthy diet, getting regular exercise, not using drugs or products that contain nicotine and tobacco, and limiting alcohol use. °What can I expect for my preventive care visit? °Physical exam °Your health care provider will check your: °· Height and weight. This may be used to calculate body mass index (BMI), which tells if you are at a healthy weight. °· Heart rate and blood pressure. °· Skin for abnormal spots. °Counseling °Your health care provider may ask you questions about your: °· Alcohol, tobacco, and drug use. °· Emotional well-being. °· Home and relationship well-being. °· Sexual activity. °· Eating habits. °· Work and work environment. °· Method of birth control. °· Menstrual cycle. °· Pregnancy history. °What immunizations do I need? ° °Influenza (flu) vaccine °· This is recommended every year. °Tetanus, diphtheria, and pertussis (Tdap) vaccine °· You may need a Td booster every 10 years. °Varicella (chickenpox) vaccine °· You may need this if you have not been vaccinated. °Zoster (shingles) vaccine °· You may need this after age 60. °Measles, mumps, and rubella (MMR) vaccine °· You may need at  least one dose of MMR if you were born in 1957 or later. You may also need a second dose. °Pneumococcal conjugate (PCV13) vaccine °· You may need this if you have certain conditions and were not previously vaccinated. °Pneumococcal polysaccharide (PPSV23) vaccine °· You may need one or two doses if you smoke cigarettes or if you have certain conditions. °Meningococcal conjugate (MenACWY) vaccine °· You may need this if you have certain conditions. °Hepatitis A vaccine °· You may need this if you have certain conditions or if you travel or work in places where you may be exposed to hepatitis A. °Hepatitis B vaccine °· You may need this if you have certain conditions or if you travel or work in places where you may be exposed to hepatitis B. °Haemophilus influenzae type b (Hib) vaccine °· You may need this if you have certain conditions. °Human papillomavirus (HPV) vaccine °· If recommended by your health care provider, you may need three doses over 6 months. °You may receive vaccines as individual doses or as more than one vaccine together in one shot (combination vaccines). Talk with your health care provider about the risks and benefits of combination vaccines. °What tests do I need? °Blood tests °· Lipid and cholesterol levels. These may be checked every 5 years, or more frequently if you are over 50 years old. °· Hepatitis C test. °· Hepatitis B test. °Screening °· Lung cancer screening. You may have this screening every year starting at age 55 if you have a 30-pack-year history of smoking and currently smoke or   have quit within the past 15 years. °· Colorectal cancer screening. All adults should have this screening starting at age 50 and continuing until age 75. Your health care provider may recommend screening at age 45 if you are at increased risk. You will have tests every 1-10 years, depending on your results and the type of screening test. °· Diabetes screening. This is done by checking your blood sugar  (glucose) after you have not eaten for a while (fasting). You may have this done every 1-3 years. °· Mammogram. This may be done every 1-2 years. Talk with your health care provider about when you should start having regular mammograms. This may depend on whether you have a family history of breast cancer. °· BRCA-related cancer screening. This may be done if you have a family history of breast, ovarian, tubal, or peritoneal cancers. °· Pelvic exam and Pap test. This may be done every 3 years starting at age 21. Starting at age 30, this may be done every 5 years if you have a Pap test in combination with an HPV test. °Other tests °· Sexually transmitted disease (STD) testing. °· Bone density scan. This is done to screen for osteoporosis. You may have this scan if you are at high risk for osteoporosis. °Follow these instructions at home: °Eating and drinking °· Eat a diet that includes fresh fruits and vegetables, whole grains, lean protein, and low-fat dairy. °· Take vitamin and mineral supplements as recommended by your health care provider. °· Do not drink alcohol if: °? Your health care provider tells you not to drink. °? You are pregnant, may be pregnant, or are planning to become pregnant. °· If you drink alcohol: °? Limit how much you have to 0-1 drink a day. °? Be aware of how much alcohol is in your drink. In the [place] one drink equals one 12 oz bottle of beer (355 mL), one 5 oz glass of wine (148 mL), or one 1½ oz glass of hard liquor (44 mL). °Lifestyle °· Take daily care of your teeth and gums. °· Stay active. Exercise for at least 30 minutes on 5 or more days each week. °· Do not use any products that contain nicotine or tobacco, such as cigarettes, e-cigarettes, and chewing tobacco. If you need help quitting, ask your health care provider. °· If you are sexually active, practice safe sex. Use a condom or other form of birth control (contraception) in order to prevent pregnancy and STIs (sexually  transmitted infections). °· If told by your health care provider, take low-dose aspirin daily starting at age 50. °What's next? °· Visit your health care provider once a year for a well check visit. °· Ask your health care provider how often you should have your eyes and teeth checked. °· Stay up to date on all vaccines. °This information is not intended to replace advice given to you by your health care provider. Make sure you discuss any questions you have with your health care provider. °Document Revised: 05/19/2018 Document Reviewed: 05/19/2018 °Elsevier Patient Education © 2020 [employer] ° °

## 2020-03-22 NOTE — Progress Notes (Signed)
Erika Ray is a 59 y.o. female presents to office today for annual physical exam examination.    Concerns today include: 1. Needs work form completed  Occupation: works for [employer], Marital status: married, Substance use: none Diet: fair, Exercise: none Last colonoscopy: UTD Last mammogram: UTD Last pap smear: UTD, ROI completed Refills needed today: Lipitor, Fosamax  Past Medical History:  Diagnosis Date  . Broken arm 08/1966   left  . Rectal bleeding   . Rectal pain   . Thrombosed hemorrhoids    Social History   Socioeconomic History  . Marital status: Married    Spouse name: Not on file  . Number of children: Not on file  . Years of education: Not on file  . Highest education level: Not on file  Occupational History  . Not on file  Tobacco Use  . Smoking status: Never Smoker  . Smokeless tobacco: Never Used  Vaping Use  . Vaping Use: Never used  Substance and Sexual Activity  . Alcohol use: No  . Drug use: No  . Sexual activity: Not on file  Other Topics Concern  . Not on file  Social History Narrative  . Not on file   Social Determinants of Health   Financial Resource Strain:   . Difficulty of Paying Living Expenses:   Food Insecurity:   . Worried About Charity fundraiser in the Last Year:   . Arboriculturist in the Last Year:   Transportation Needs:   . Film/video editor (Medical):   Marland Kitchen Lack of Transportation (Non-Medical):   Physical Activity:   . Days of Exercise per Week:   . Minutes of Exercise per Session:   Stress:   . Feeling of Stress :   Social Connections:   . Frequency of Communication with Friends and Family:   . Frequency of Social Gatherings with Friends and Family:   . Attends Religious Services:   . Active Member of Clubs or Organizations:   . Attends Archivist Meetings:   Marland Kitchen Marital Status:   Intimate Partner Violence:   . Fear of Current or Ex-Partner:   . Emotionally Abused:   Marland Kitchen Physically Abused:     . Sexually Abused:    Past Surgical History:  Procedure Laterality Date  . FRACTURE SURGERY  1967   left arm  . HEMORROIDECTOMY  06/11/11   thromb hems   Family History  Problem Relation Age of Onset  . Cancer Mother 16       ovarian  . Heart disease Father 58  . COPD Father   . Cancer Maternal Grandmother 13       breast  . Thyroid disease Maternal Grandmother   . Thyroid disease Maternal Aunt   . Thyroid disease Maternal Uncle     Current Outpatient Medications:  .  alendronate (FOSAMAX) 70 MG tablet, Take 1 tablet (70 mg total) by mouth every 7 (seven) days. Take with a full glass of water on an empty stomach., Disp: 12 tablet, Rfl: 3 .  atorvastatin (LIPITOR) 10 MG tablet, Take 1 tablet (10 mg total) by mouth daily., Disp: 90 tablet, Rfl: 3 .  cyclobenzaprine (FLEXERIL) 10 MG tablet, Take 1 tablet (10 mg total) by mouth 3 (three) times daily as needed for muscle spasms., Disp: 30 tablet, Rfl: 2 .  OVER THE COUNTER MEDICATION, Vitafusion 1000units, Disp: , Rfl:  .  valACYclovir (VALTREX) 500 MG tablet, TAKE 1 TABLET 3 TIMES A DAY  FOR 7 DAYS, Disp: 21 tablet, Rfl: 11  Allergies  Allergen Reactions  . Ampicillin Rash  . Penicillins Hives    All over the body, except the face.     ROS: Review of Systems Pertinent items noted in HPI and remainder of comprehensive ROS otherwise negative.    Physical exam BP 113/74   Pulse 60   Temp 97.6 F (36.4 C) (Temporal)   Ht '5\' 2"'$  (1.575 m)   Wt 165 lb (74.8 kg)   LMP 10/22/2014   SpO2 (!) 80%   BMI 30.18 kg/m  General appearance: alert, cooperative, appears stated age and no distress Head: Normocephalic, without obvious abnormality, atraumatic Eyes: negative findings: lids and lashes normal, conjunctivae and sclerae normal, corneas clear and pupils equal, round, reactive to light and accomodation Ears: normal TM's and external ear canals both ears Nose: Nares normal. Septum midline. Mucosa normal. No drainage or sinus  tenderness. Throat: lips, mucosa, and tongue normal; teeth and gums normal Neck: no adenopathy, no carotid bruit, supple, symmetrical, trachea midline and thyroid not enlarged, symmetric, no tenderness/mass/nodules Back: symmetric, no curvature. ROM normal. No CVA tenderness. Lungs: clear to auscultation bilaterally Heart: regular rate and rhythm, S1, S2 normal, no murmur, click, rub or gallop Abdomen: soft, non-tender; bowel sounds normal; no masses,  no organomegaly Extremities: extremities normal, atraumatic, no cyanosis or edema Pulses: 2+ and symmetric Skin: Skin color, texture, turgor normal. No rashes or lesions Lymph nodes: Cervical, supraclavicular, and axillary nodes normal. Neurologic: Alert and oriented X 3, normal strength and tone. Normal symmetric reflexes. Normal coordination and gait Psych:  Mood stable, speech normal, affect appropriate Depression screen [facility] 2/9 03/22/2020 09/01/2019 04/28/2019  Decreased Interest 0 0 0  Down, Depressed, Hopeless 0 0 1  PHQ - 2 Score 0 0 1  Altered sleeping 0 - -  Tired, decreased energy 0 - -  Change in appetite 0 - -  Feeling bad or failure about yourself  0 - -  Trouble concentrating 0 - -  Moving slowly or fidgety/restless 0 - -  Suicidal thoughts 0 - -  PHQ-9 Score 0 - -    Assessment/ Plan: Erika Ray here for annual physical exam.   1. Annual physical exam Form completed and returned to patient  2. Age related osteoporosis, unspecified pathological fracture presence Handout provided.  Osteopenic on DEXA but h/o unprovoked fracture in left foot = osteoporosis diagnosis - alendronate (FOSAMAX) 70 MG tablet; Take 1 tablet (70 mg total) by mouth every 7 (seven) days. Take with a full glass of water on an empty stomach.  Dispense: 12 tablet; Refill: 3 - CMP14+EGFR - CBC with Differential - VITAMIN D 25 Hydroxy (Vit-D Deficiency, Fractures)  3. Elevated LDL cholesterol level Has been fasting since 6am - atorvastatin  (LIPITOR) 10 MG tablet; Take 1 tablet (10 mg total) by mouth daily.  Dispense: 90 tablet; Refill: 3 - Lipid Panel - CMP14+EGFR - TSH  4. Stress at work She reports some moodiness/stress at work that precipitates as anxiety symptoms.  This occurs less than one time per week.  This was discovered on routine review of systems.  May consider something like Effexor if needed given some postmenopausal symptoms as well   Counseled on healthy lifestyle choices, including diet (rich in fruits, vegetables and lean meats and low in salt and simple carbohydrates) and exercise (at least 30 minutes of moderate physical activity daily).  Patient to follow up in 1 year for annual exam or sooner if needed.  Erika Ray M. Lajuana Ripple, DO

## 2020-03-23 LAB — CMP14+EGFR
ALT: 13 IU/L (ref 0–32)
AST: 19 IU/L (ref 0–40)
Albumin/Globulin Ratio: 2.3 — ABNORMAL HIGH (ref 1.2–2.2)
Albumin: 4.8 g/dL (ref 3.8–4.9)
Alkaline Phosphatase: 77 IU/L (ref 48–121)
BUN/Creatinine Ratio: 18 (ref 9–23)
BUN: 12 mg/dL (ref 6–24)
Bilirubin Total: 0.5 mg/dL (ref 0.0–1.2)
CO2: 23 mmol/L (ref 20–29)
Calcium: 9.5 mg/dL (ref 8.7–10.2)
Chloride: 103 mmol/L (ref 96–106)
Creatinine, Ser: 0.65 mg/dL (ref 0.57–1.00)
GFR calc Af Amer: 113 mL/min/{1.73_m2} (ref >59)
GFR calc non Af Amer: 98 mL/min/{1.73_m2} (ref >59)
Globulin, Total: 2.1 g/dL (ref 1.5–4.5)
Glucose: 79 mg/dL (ref 65–99)
Potassium: 3.9 mmol/L (ref 3.5–5.2)
Sodium: 141 mmol/L (ref 134–144)
Total Protein: 6.9 g/dL (ref 6.0–8.5)

## 2020-03-23 LAB — CBC WITH DIFFERENTIAL/PLATELET
Basophils Absolute: 0.1 10*3/uL (ref 0.0–0.2)
Basos: 1 %
EOS (ABSOLUTE): 0.2 10*3/uL (ref 0.0–0.4)
Eos: 2 %
Hematocrit: 38.4 % (ref 34.0–46.6)
Hemoglobin: 13.3 g/dL (ref 11.1–15.9)
Immature Grans (Abs): 0 10*3/uL (ref 0.0–0.1)
Immature Granulocytes: 0 %
Lymphocytes Absolute: 2.3 10*3/uL (ref 0.7–3.1)
Lymphs: 25 %
MCH: 30.7 pg (ref 26.6–33.0)
MCHC: 34.6 g/dL (ref 31.5–35.7)
MCV: 89 fL (ref 79–97)
Monocytes Absolute: 0.7 10*3/uL (ref 0.1–0.9)
Monocytes: 8 %
Neutrophils Absolute: 5.9 10*3/uL (ref 1.4–7.0)
Neutrophils: 64 %
Platelets: 237 10*3/uL (ref 150–450)
RBC: 4.33 x10E6/uL (ref 3.77–5.28)
RDW: 12.1 % (ref 11.7–15.4)
WBC: 9.1 10*3/uL (ref 3.4–10.8)

## 2020-03-23 LAB — LIPID PANEL
Chol/HDL Ratio: 2.3 ratio (ref 0.0–4.4)
Cholesterol, Total: 194 mg/dL (ref 100–199)
HDL: 84 mg/dL (ref >39)
LDL Chol Calc (NIH): 98 mg/dL (ref 0–99)
Triglycerides: 63 mg/dL (ref 0–149)
VLDL Cholesterol Cal: 12 mg/dL (ref 5–40)

## 2020-03-23 LAB — TSH: TSH: 2.25 u[IU]/mL (ref 0.450–4.500)

## 2020-03-23 LAB — VITAMIN D 25 HYDROXY (VIT D DEFICIENCY, FRACTURES): Vit D, 25-Hydroxy: 50.3 ng/mL (ref 30.0–100.0)

## 2020-05-24 ENCOUNTER — Other Ambulatory Visit: Payer: Self-pay

## 2020-05-24 DIAGNOSIS — M810 Age-related osteoporosis without current pathological fracture: Secondary | ICD-10-CM

## 2020-05-24 MED ORDER — ALENDRONATE SODIUM 70 MG PO TABS: 70.0000 mg | ORAL_TABLET | ORAL | 3 refills | Status: DC

## 2020-07-12 ENCOUNTER — Ambulatory Visit (INDEPENDENT_AMBULATORY_CARE_PROVIDER_SITE_OTHER): Payer: Managed Care, Other (non HMO)

## 2020-07-12 ENCOUNTER — Other Ambulatory Visit: Payer: Self-pay

## 2020-07-12 DIAGNOSIS — Z23 Encounter for immunization: Secondary | ICD-10-CM

## 2020-08-27 DIAGNOSIS — L821 Other seborrheic keratosis: Secondary | ICD-10-CM | POA: Diagnosis not present

## 2020-08-27 DIAGNOSIS — Z1283 Encounter for screening for malignant neoplasm of skin: Secondary | ICD-10-CM | POA: Diagnosis not present

## 2020-09-04 ENCOUNTER — Ambulatory Visit: Payer: Managed Care, Other (non HMO)

## 2020-10-28 ENCOUNTER — Other Ambulatory Visit: Payer: Self-pay | Admitting: Obstetrics and Gynecology

## 2020-10-28 DIAGNOSIS — Z1231 Encounter for screening mammogram for malignant neoplasm of breast: Secondary | ICD-10-CM

## 2020-12-18 ENCOUNTER — Inpatient Hospital Stay: Admission: RE | Admit: 2020-12-18 | Payer: 59 | Source: Ambulatory Visit

## 2020-12-20 ENCOUNTER — Other Ambulatory Visit: Payer: Self-pay

## 2020-12-20 ENCOUNTER — Ambulatory Visit
Admission: RE | Admit: 2020-12-20 | Discharge: 2020-12-20 | Disposition: A | Discharge status: Home / Self Care | Payer: 59 | Source: Ambulatory Visit | Attending: Obstetrics and Gynecology | Admitting: Obstetrics and Gynecology

## 2020-12-20 DIAGNOSIS — Z1231 Encounter for screening mammogram for malignant neoplasm of breast: Secondary | ICD-10-CM | POA: Diagnosis not present

## 2020-12-23 ENCOUNTER — Other Ambulatory Visit: Payer: Self-pay | Admitting: Obstetrics & Gynecology

## 2020-12-23 DIAGNOSIS — R928 Other abnormal and inconclusive findings on diagnostic imaging of breast: Secondary | ICD-10-CM

## 2021-01-08 DIAGNOSIS — Z01419 Encounter for gynecological examination (general) (routine) without abnormal findings: Secondary | ICD-10-CM | POA: Diagnosis not present

## 2021-01-08 DIAGNOSIS — Z124 Encounter for screening for malignant neoplasm of cervix: Secondary | ICD-10-CM | POA: Diagnosis not present

## 2021-01-10 ENCOUNTER — Ambulatory Visit
Admission: RE | Admit: 2021-01-10 | Discharge: 2021-01-10 | Disposition: A | Discharge status: Home / Self Care | Payer: 59 | Source: Ambulatory Visit | Attending: Obstetrics & Gynecology | Admitting: Obstetrics & Gynecology

## 2021-01-10 ENCOUNTER — Other Ambulatory Visit: Payer: Self-pay

## 2021-01-10 DIAGNOSIS — R928 Other abnormal and inconclusive findings on diagnostic imaging of breast: Secondary | ICD-10-CM

## 2021-01-10 DIAGNOSIS — N6001 Solitary cyst of right breast: Secondary | ICD-10-CM | POA: Diagnosis not present

## 2021-01-20 ENCOUNTER — Encounter: Payer: Self-pay | Admitting: Family Medicine

## 2021-01-20 ENCOUNTER — Ambulatory Visit: Payer: BC Managed Care – PPO | Admitting: Family Medicine

## 2021-01-20 ENCOUNTER — Other Ambulatory Visit: Payer: Self-pay

## 2021-01-20 VITALS — BP 130/83 | HR 79 | Temp 97.6°F | Resp 20 | Ht 62.0 in | Wt 167.0 lb

## 2021-01-20 DIAGNOSIS — L2089 Other atopic dermatitis: Secondary | ICD-10-CM

## 2021-01-20 DIAGNOSIS — E7800 Pure hypercholesterolemia, unspecified: Secondary | ICD-10-CM | POA: Diagnosis not present

## 2021-01-20 DIAGNOSIS — M810 Age-related osteoporosis without current pathological fracture: Secondary | ICD-10-CM

## 2021-01-20 DIAGNOSIS — F424 Excoriation (skin-picking) disorder: Secondary | ICD-10-CM | POA: Diagnosis not present

## 2021-01-20 MED ORDER — METHYLPREDNISOLONE ACETATE 40 MG/ML IJ SUSP
40.0000 mg | Freq: Once | INTRAMUSCULAR | Status: AC
  Administered 2021-01-20: 40 mg via INTRAMUSCULAR

## 2021-01-20 MED ORDER — ATORVASTATIN CALCIUM 10 MG PO TABS: 10.0000 mg | ORAL_TABLET | Freq: Every day | ORAL | 3 refills | Status: DC

## 2021-01-20 MED ORDER — ALENDRONATE SODIUM 70 MG PO TABS: 70.0000 mg | ORAL_TABLET | ORAL | 3 refills | Status: DC

## 2021-01-20 MED ORDER — TRIAMCINOLONE ACETONIDE 0.1 % EX CREA: 1.0000 "application " | TOPICAL_CREAM | Freq: Two times a day (BID) | CUTANEOUS | 0 refills | Status: DC

## 2021-01-20 NOTE — Progress Notes (Signed)
Subjective: CC: med refills PCP: Erika Norlander, Erika Ray YQM:GNOIB Erika Ray is a 60 y.o. female presenting to clinic today for:  1.  Osteoporosis 2020 DEXA scan showed osteopenic range T score but she has a history of foot fracture and therefore is treated with Fosamax.  She reports compliance with Fosamax.  She needs a refill.  Next DEXA scan planned for September of this year.  2.  Hyperlipidemia Compliant with Lipitor 10 mg daily.  Reports of chest pain, shortness of breath, abdominal pain or jaundice  3.  Rash Patient reports a rash on the back of her neck and back of her arms of the been present for the last couple of weeks.  She has not used anything specific but has been trying to use products like CeraVe to moisturize and see the skin.  She uses Tide and she notes that this does aggravate her skin but she has been double washing her clothes in efforts to make sure she does not get a reaction.  She reports that the rash is itchy and she often picks at the lesions.  ROS: Per HPI  Allergies  Allergen Reactions  . Ampicillin Rash  . Penicillins Hives    All over the body, except the face.   Past Medical History:  Diagnosis Date  . Broken arm 08/1966   left  . Rectal bleeding   . Rectal pain   . Thrombosed hemorrhoids     Current Outpatient Medications:  .  alendronate (FOSAMAX) 70 MG tablet, Take 1 tablet (70 mg total) by mouth every 7 (seven) days. Take with a full glass of water on an empty stomach., Disp: 12 tablet, Rfl: 3 .  atorvastatin (LIPITOR) 10 MG tablet, Take 1 tablet (10 mg total) by mouth daily., Disp: 90 tablet, Rfl: 3 .  cyclobenzaprine (FLEXERIL) 10 MG tablet, Take 1 tablet (10 mg total) by mouth 3 (three) times daily as needed for muscle spasms., Disp: 30 tablet, Rfl: 2 .  OVER THE COUNTER MEDICATION, Vitafusion 1000units, Disp: , Rfl:  .  valACYclovir (VALTREX) 500 MG tablet, TAKE 1 TABLET 3 TIMES A DAY FOR 7 DAYS, Disp: 21 tablet, Rfl: 11 Social History    Socioeconomic History  . Marital status: Married    Spouse name: Not on file  . Number of children: 0  . Years of education: Not on file  . Highest education level: Not on file  Occupational History  . Not on file  Tobacco Use  . Smoking status: Never Smoker  . Smokeless tobacco: Never Used  Vaping Use  . Vaping Use: Never used  Substance and Sexual Activity  . Alcohol use: No  . Drug use: No  . Sexual activity: Not on file  Other Topics Concern  . Not on file  Social History Narrative   Married, no children.  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   Works for [employer] Strain: Not on [employer] Insecurity: Not on file  Transportation Needs: Not on file  Physical Activity: Not on file  Stress: Not on file  Social Connections: Not on file  Intimate Partner Violence: Not on file   Family History  Problem Relation Age of Onset  . Cancer Mother 31       ovarian  . Heart disease Father 38  . COPD Father   . Cancer Maternal Grandmother 69       breast  . Thyroid disease Maternal  Grandmother   . Thyroid disease Maternal Aunt   . Thyroid disease Maternal Uncle     Objective: Office vital signs reviewed. BP 130/83   Pulse 79   Temp 97.6 F (36.4 C)   Resp 20   Ht _0  (1.575 m)   Wt 167 lb (75.8 kg)   LMP 10/22/2014   SpO2 95%   BMI 30.54 kg/m   Physical Examination:  General: Awake, alert, well nourished, No acute distress HEENT: Normal; sclera white Cardio: regular rate and rhythm, S1S2 heard, no murmurs appreciated Pulm: clear to auscultation bilaterally, no wheezes, rhonchi or rales; normal work of breathing on room air Extremities: warm, well perfused, No edema, cyanosis or clubbing; +2 pulses bilaterally Skin: Erythematous maculopapular rash noted along the posterior neck and posterior arms.  She has a few lesions that have been picked at and are excoriated.  No active drainage or anything to suggest  secondary bacterial infection  Assessment/ Plan: 60 y.o. female   Age related osteoporosis, unspecified pathological fracture presence - Plan: CMP14+EGFR, TSH, VITAMIN D 25 Hydroxy (Vit-D Deficiency, Fractures), alendronate (FOSAMAX) 70 MG tablet, VITAMIN D 25 Hydroxy (Vit-D Deficiency, Fractures), TSH, CMP14+EGFR, DG [facility] DEXA  Elevated LDL cholesterol level - Plan: Lipid panel, atorvastatin (LIPITOR) 10 MG tablet, Lipid panel  Other atopic dermatitis - Plan: methylPREDNISolone acetate (DEPO-MEDROL) injection 40 mg, triamcinolone cream (KENALOG) 0.1 %  Picking own skin  Labs ordered.  She wished to collect even though she is not fasting.  Continue Fosamax for now.  Plan for DEXA at the 2-year interval  Her lesions appeared consistent with atopic dermatitis and picking.  Advised to resume use of Zyrtec.  She was given a corticosteroid injection and triamcinolone cream was advised twice daily as needed going forward.  Continue unscented detergents and creams.  Follow-up as needed on this issue  No orders of the defined types were placed in this encounter.  No orders of the defined types were placed in this encounter.    Erika Norlander, Erika Ray Peever [phone]

## 2021-01-21 LAB — CMP14+EGFR
ALT: 15 IU/L (ref 0–32)
AST: 20 IU/L (ref 0–40)
Albumin/Globulin Ratio: 2.2 (ref 1.2–2.2)
Albumin: 4.7 g/dL (ref 3.8–4.9)
Alkaline Phosphatase: 84 IU/L (ref 44–121)
BUN/Creatinine Ratio: 17 (ref 9–23)
BUN: 12 mg/dL (ref 6–24)
Bilirubin Total: 0.3 mg/dL (ref 0.0–1.2)
CO2: 22 mmol/L (ref 20–29)
Calcium: 9.8 mg/dL (ref 8.7–10.2)
Chloride: 100 mmol/L (ref 96–106)
Creatinine, Ser: 0.71 mg/dL (ref 0.57–1.00)
Globulin, Total: 2.1 g/dL (ref 1.5–4.5)
Glucose: 80 mg/dL (ref 65–99)
Potassium: 4.1 mmol/L (ref 3.5–5.2)
Sodium: 142 mmol/L (ref 134–144)
Total Protein: 6.8 g/dL (ref 6.0–8.5)
eGFR: 98 mL/min/{1.73_m2} (ref >59)

## 2021-01-21 LAB — VITAMIN D 25 HYDROXY (VIT D DEFICIENCY, FRACTURES): Vit D, 25-Hydroxy: 48.2 ng/mL (ref 30.0–100.0)

## 2021-01-21 LAB — LIPID PANEL
Chol/HDL Ratio: 2.5 ratio (ref 0.0–4.4)
Cholesterol, Total: 193 mg/dL (ref 100–199)
HDL: 76 mg/dL (ref >39)
LDL Chol Calc (NIH): 100 mg/dL — ABNORMAL HIGH (ref 0–99)
Triglycerides: 95 mg/dL (ref 0–149)
VLDL Cholesterol Cal: 17 mg/dL (ref 5–40)

## 2021-01-21 LAB — TSH: TSH: 3.65 u[IU]/mL (ref 0.450–4.500)

## 2021-04-07 ENCOUNTER — Encounter: Payer: Self-pay | Admitting: Family Medicine

## 2021-04-07 ENCOUNTER — Other Ambulatory Visit: Payer: Self-pay

## 2021-04-07 ENCOUNTER — Ambulatory Visit (INDEPENDENT_AMBULATORY_CARE_PROVIDER_SITE_OTHER): Payer: BC Managed Care – PPO | Admitting: Family Medicine

## 2021-04-07 VITALS — BP 114/75 | HR 62 | Temp 97.5°F | Ht 62.0 in | Wt 166.8 lb

## 2021-04-07 DIAGNOSIS — Z0001 Encounter for general adult medical examination with abnormal findings: Secondary | ICD-10-CM | POA: Diagnosis not present

## 2021-04-07 DIAGNOSIS — Z0000 Encounter for general adult medical examination without abnormal findings: Secondary | ICD-10-CM

## 2021-04-07 DIAGNOSIS — E200 Idiopathic hypoparathyroidism: Secondary | ICD-10-CM | POA: Diagnosis not present

## 2021-04-07 DIAGNOSIS — E7800 Pure hypercholesterolemia, unspecified: Secondary | ICD-10-CM | POA: Diagnosis not present

## 2021-04-07 DIAGNOSIS — L2089 Other atopic dermatitis: Secondary | ICD-10-CM | POA: Diagnosis not present

## 2021-04-07 DIAGNOSIS — M810 Age-related osteoporosis without current pathological fracture: Secondary | ICD-10-CM | POA: Diagnosis not present

## 2021-04-07 NOTE — Patient Instructions (Signed)
You had labs performed today.  You will be contacted with the results of the labs once they are available, usually in the next 3 business days for routine lab work.  If you have an active my chart account, they will be released to your MyChart.  If you prefer to have these labs released to you via telephone, please let us know.  If you had a pap smear or biopsy performed, expect to be contacted in about 7-10 days. Preventive Care 40-59 Years Old, Female Preventive care refers to lifestyle choices and visits with your health care provider that can promote health and wellness. This includes: A yearly physical exam. This is also called an annual wellness visit. Regular dental and eye exams. Immunizations. Screening for certain conditions. Healthy lifestyle choices, such as: Eating a healthy diet. Getting regular exercise. Not using drugs or products that contain nicotine and tobacco. Limiting alcohol use. What can I expect for my preventive care visit? Physical exam Your health care provider will check your: Height and weight. These may be used to calculate your BMI (body mass index). BMI is a measurement that tells if you are at a healthy weight. Heart rate and blood pressure. Body temperature. Skin for abnormal spots. Counseling Your health care provider may ask you questions about your: Past medical problems. Family's medical history. Alcohol, tobacco, and drug use. Emotional well-being. Home life and relationship well-being. Sexual activity. Diet, exercise, and sleep habits. Work and work environment. Access to firearms. Method of birth control. Menstrual cycle. Pregnancy history. What immunizations do I need?  Vaccines are usually given at various ages, according to a schedule. Your health care provider will recommend vaccines for you based on your age, medicalhistory, and lifestyle or other factors, such as travel or where you work. What tests do I need? Blood tests Lipid  and cholesterol levels. These may be checked every 5 years, or more often if you are over 50 years old. Hepatitis C test. Hepatitis B test. Screening Lung cancer screening. You may have this screening every year starting at age 55 if you have a 30-pack-year history of smoking and currently smoke or have quit within the past 15 years. Colorectal cancer screening. All adults should have this screening starting at age 50 and continuing until age 75. Your health care provider may recommend screening at age 45 if you are at increased risk. You will have tests every 1-10 years, depending on your results and the type of screening test. Diabetes screening. This is done by checking your blood sugar (glucose) after you have not eaten for a while (fasting). You may have this done every 1-3 years. Mammogram. This may be done every 1-2 years. Talk with your health care provider about when you should start having regular mammograms. This may depend on whether you have a family history of breast cancer. BRCA-related cancer screening. This may be done if you have a family history of breast, ovarian, tubal, or peritoneal cancers. Pelvic exam and Pap test. This may be done every 3 years starting at age 21. Starting at age 30, this may be done every 5 years if you have a Pap test in combination with an HPV test. Other tests STD (sexually transmitted disease) testing, if you are at risk. Bone density scan. This is done to screen for osteoporosis. You may have this scan if you are at high risk for osteoporosis. Talk with your health care provider about your test results, treatment options,and if necessary, the need for   more tests. Follow these instructions at home: Eating and drinking  Eat a diet that includes fresh fruits and vegetables, whole grains, lean protein, and low-fat dairy products. Take vitamin and mineral supplements as recommended by your health care provider. Do not drink alcohol if: Your  health care provider tells you not to drink. You are pregnant, may be pregnant, or are planning to become pregnant. If you drink alcohol: Limit how much you have to 0-1 drink a day. Be aware of how much alcohol is in your drink. In the [place] one drink equals one 12 oz bottle of beer (355 mL), one 5 oz glass of wine (148 mL), or one 1 oz glass of hard liquor (44 mL).  Lifestyle Take daily care of your teeth and gums. Brush your teeth every morning and night with fluoride toothpaste. Floss one time each day. Stay active. Exercise for at least 30 minutes 5 or more days each week. Do not use any products that contain nicotine or tobacco, such as cigarettes, e-cigarettes, and chewing tobacco. If you need help quitting, ask your health care provider. Do not use drugs. If you are sexually active, practice safe sex. Use a condom or other form of protection to prevent STIs (sexually transmitted infections). If you do not wish to become pregnant, use a form of birth control. If you plan to become pregnant, see your health care provider for a prepregnancy visit. If told by your health care provider, take low-dose aspirin daily starting at age 50. Find healthy ways to cope with stress, such as: Meditation, yoga, or listening to music. Journaling. Talking to a trusted person. Spending time with friends and family. Safety Always wear your seat belt while driving or riding in a vehicle. Do not drive: If you have been drinking alcohol. Do not ride with someone who has been drinking. When you are tired or distracted. While texting. Wear a helmet and other protective equipment during sports activities. If you have firearms in your house, make sure you follow all gun safety procedures. What's next? Visit your health care provider once a year for an annual wellness visit. Ask your health care provider how often you should have your eyes and teeth checked. Stay up to date on all vaccines. This information  is not intended to replace advice given to you by your health care provider. Make sure you discuss any questions you have with your healthcare provider. Document Revised: 06/11/2020 Document Reviewed: 05/19/2018 Elsevier Patient Education  2022 Elsevier Inc.  

## 2021-04-07 NOTE — Progress Notes (Signed)
Erika Ray is a 60 y.o. female presents to office today for annual physical exam examination.    Concerns today include: 1. Osteoporosis With history of pathologic fracture in her foot.  Treated with Fosamax and she is compliant with this.  She will have her next DEXA scan in September of this year.  No reports of bony pain or uncontrolled GERD  2. HLD Compliant with Lipitor 10 mg daily.  No abdominal pain, chest pain or shortness of breath.  Occupation: Teacher, English as a foreign language, Marital status: married, Substance use: none Diet: balanced, Exercise: active at work Last eye exam: UTD Last dental exam: q30mLast colonoscopy: UTD Last mammogram: UTD Last pap smear: UTD Refills needed today: none Immunizations needed:  Immunization History  Administered Date(s) Administered   Influenza,inj,Quad PF,6+ Mos 09/02/2015, 07/20/2017, 08/12/2018, 07/12/2020   Influenza,inj,quad, With Preservative 06/19/2019   Influenza-Unspecified 06/09/2019   PFIZER(Purple Top)SARS-COV-2 Vaccination 12/06/2019, 12/27/2019, 08/08/2020   Zoster Recombinat (Shingrix) 02/04/2017, 04/29/2017     Past Medical History:  Diagnosis Date   Broken arm 08/1966   left   Rectal bleeding    Rectal pain    Thrombosed hemorrhoids    Social History   Socioeconomic History   Marital status: Married    Spouse name: Not on file   Number of children: 0   Years of education: Not on file   Highest education level: Not on file  Occupational History   Not on file  Tobacco Use   Smoking status: Never   Smokeless tobacco: Never  Vaping Use   Vaping Use: Never used  Substance and Sexual Activity   Alcohol use: No   Drug use: No   Sexual activity: Not on file  Other Topics Concern   Not on file  Social History Narrative   Married, no children.  Has a mini aCubasheppard   Works for PVirgieStrain: Not on fComcastInsecurity: Not on file   Transportation Needs: Not on file  Physical Activity: Not on file  Stress: Not on file  Social Connections: Not on file  Intimate Partner Violence: Not on file   Past Surgical History:  [place]   HEMORROIDECTOMY  06/11/11   thromb hems   Family History  Problem Relation Age of Onset   Cancer Mother 552      ovarian   Heart disease Father 845  COPD Father    Cancer Maternal Grandmother 88       breast   Thyroid disease Maternal Grandmother    Thyroid disease Maternal Aunt    Thyroid disease Maternal Uncle     Current Outpatient Medications:    alendronate (FOSAMAX) 70 MG tablet, Take 1 tablet (70 mg total) by mouth every 7 (seven) days. Take with a full glass of water on an empty stomach., Disp: 12 tablet, Rfl: 3   atorvastatin (LIPITOR) 10 MG tablet, Take 1 tablet (10 mg total) by mouth daily., Disp: 90 tablet, Rfl: 3   cyclobenzaprine (FLEXERIL) 10 MG tablet, Take 1 tablet (10 mg total) by mouth 3 (three) times daily as needed for muscle spasms., Disp: 30 tablet, Rfl: 2   OVER THE COUNTER MEDICATION, Vitafusion 1000units, Disp: , Rfl:    triamcinolone cream (KENALOG) 0.1 %, Apply 1 application topically 2 (two) times daily. For up to 7 days per flare, Disp: 30 g, Rfl:  0   valACYclovir (VALTREX) 500 MG tablet, TAKE 1 TABLET 3 TIMES A DAY FOR 7 DAYS, Disp: 21 tablet, Rfl: 11  Allergies  Allergen Reactions   Ampicillin Rash   Penicillins Hives    All over the body, except the face.     ROS: Review of Systems Pertinent items noted in HPI and remainder of comprehensive ROS otherwise negative.    Physical exam BP 114/75   Pulse 62   Temp (!) 97.5 F (36.4 C)   Ht 5' 2"  (1.575 m)   Wt 166 lb 12.8 oz (75.7 kg)   LMP 10/22/2014   SpO2 95%   BMI 30.51 kg/m  General appearance: alert, cooperative, appears stated age, and no distress Head: Normocephalic, without obvious abnormality, atraumatic Eyes:  conjunctivae/corneas clear. PERRL, EOM's intact. Fundi benign. Ears:  right TM obscured by cerumen. Nose: Nares normal. Septum midline. Mucosa normal. No drainage or sinus tenderness. Throat: lips, mucosa, and tongue normal; teeth and gums normal Neck: no adenopathy, no carotid bruit, supple, symmetrical, trachea midline, and thyroid not enlarged, symmetric, no tenderness/mass/nodules Back: symmetric, no curvature. ROM normal. No CVA tenderness. Lungs: clear to auscultation bilaterally Heart: regular rate and rhythm, S1, S2 normal, no murmur, click, rub or gallop Abdomen: soft, non-tender; bowel sounds normal; no masses,  no organomegaly Extremities: extremities normal, atraumatic, no cyanosis or edema Pulses: 2+ and symmetric Skin:  abrasion on left shin Lymph nodes: Cervical, supraclavicular, and axillary nodes normal. Neurologic: Alert and oriented X 3, normal strength and tone. Normal symmetric reflexes. Normal coordination and gait Psych: mood stable   The 10-year ASCVD risk score Mikey Bussing DC Jr., et al., 2013) is: 2.4%   Values used to calculate the score:     Age: 42 years     Sex: Female     Is Non-Hispanic African American: No     Diabetic: No     Tobacco smoker: No     Systolic Blood Pressure: 767 mmHg     Is BP treated: No     HDL Cholesterol: 76 mg/dL     Total Cholesterol: 193 mg/dL  Assessment/ Plan: Erika Ray here for annual physical exam.   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  Continue Fosamax.  Not yet due for DEXA scan  Fasting lipid, TSH and CMP repeated as per request  Dermatitis has resolved.  Counseled on healthy lifestyle choices, including diet (rich in fruits, vegetables and lean meats  and low in salt and simple carbohydrates) and exercise (at least 30 minutes of moderate physical activity daily).  Patient to follow up in 1 year for annual exam or sooner if needed.  Erika Ray M. Lajuana Ripple, DO

## 2021-04-08 LAB — CMP14+EGFR
ALT: 11 IU/L (ref 0–32)
AST: 18 IU/L (ref 0–40)
Albumin/Globulin Ratio: 2.2 (ref 1.2–2.2)
Albumin: 4.6 g/dL (ref 3.8–4.9)
Alkaline Phosphatase: 88 IU/L (ref 44–121)
BUN/Creatinine Ratio: 25 — ABNORMAL HIGH (ref 9–23)
BUN: 16 mg/dL (ref 6–24)
Bilirubin Total: 0.4 mg/dL (ref 0.0–1.2)
CO2: 22 mmol/L (ref 20–29)
Calcium: 9.7 mg/dL (ref 8.7–10.2)
Chloride: 102 mmol/L (ref 96–106)
Creatinine, Ser: 0.65 mg/dL (ref 0.57–1.00)
Globulin, Total: 2.1 g/dL (ref 1.5–4.5)
Glucose: 79 mg/dL (ref 65–99)
Potassium: 4.3 mmol/L (ref 3.5–5.2)
Sodium: 140 mmol/L (ref 134–144)
Total Protein: 6.7 g/dL (ref 6.0–8.5)
eGFR: 101 mL/min/{1.73_m2} (ref >59)

## 2021-04-08 LAB — CBC WITH DIFFERENTIAL/PLATELET
Basophils Absolute: 0.1 10*3/uL (ref 0.0–0.2)
Basos: 1 %
EOS (ABSOLUTE): 0.1 10*3/uL (ref 0.0–0.4)
Eos: 1 %
Hematocrit: 38.7 % (ref 34.0–46.6)
Hemoglobin: 13.4 g/dL (ref 11.1–15.9)
Immature Grans (Abs): 0 10*3/uL (ref 0.0–0.1)
Immature Granulocytes: 0 %
Lymphocytes Absolute: 2.2 10*3/uL (ref 0.7–3.1)
Lymphs: 24 %
MCH: 30.7 pg (ref 26.6–33.0)
MCHC: 34.6 g/dL (ref 31.5–35.7)
MCV: 89 fL (ref 79–97)
Monocytes Absolute: 0.7 10*3/uL (ref 0.1–0.9)
Monocytes: 8 %
Neutrophils Absolute: 5.9 10*3/uL (ref 1.4–7.0)
Neutrophils: 66 %
Platelets: 230 10*3/uL (ref 150–450)
RBC: 4.36 x10E6/uL (ref 3.77–5.28)
RDW: 12.8 % (ref 11.7–15.4)
WBC: 9 10*3/uL (ref 3.4–10.8)

## 2021-04-08 LAB — LIPID PANEL
Chol/HDL Ratio: 2.4 ratio (ref 0.0–4.4)
Cholesterol, Total: 183 mg/dL (ref 100–199)
HDL: 77 mg/dL (ref >39)
LDL Chol Calc (NIH): 91 mg/dL (ref 0–99)
Triglycerides: 83 mg/dL (ref 0–149)
VLDL Cholesterol Cal: 15 mg/dL (ref 5–40)

## 2021-04-08 LAB — TSH: TSH: 2.83 u[IU]/mL (ref 0.450–4.500)

## 2021-07-01 ENCOUNTER — Ambulatory Visit (INDEPENDENT_AMBULATORY_CARE_PROVIDER_SITE_OTHER): Payer: BC Managed Care – PPO

## 2021-07-01 ENCOUNTER — Other Ambulatory Visit: Payer: Self-pay

## 2021-07-01 DIAGNOSIS — M810 Age-related osteoporosis without current pathological fracture: Secondary | ICD-10-CM | POA: Diagnosis not present

## 2021-07-01 DIAGNOSIS — Z23 Encounter for immunization: Secondary | ICD-10-CM

## 2021-07-04 DIAGNOSIS — M8589 Other specified disorders of bone density and structure, multiple sites: Secondary | ICD-10-CM | POA: Diagnosis not present

## 2021-07-04 DIAGNOSIS — Z780 Asymptomatic menopausal state: Secondary | ICD-10-CM | POA: Diagnosis not present

## 2021-07-07 ENCOUNTER — Telehealth: Payer: Self-pay | Admitting: Family Medicine

## 2021-07-08 ENCOUNTER — Encounter: Payer: Self-pay | Admitting: Family Medicine

## 2021-07-10 ENCOUNTER — Telehealth: Payer: Self-pay | Admitting: Family Medicine

## 2021-07-10 NOTE — Telephone Encounter (Signed)
Pt called requesting to schedule appt with Dr Nadine Counts in near future around 3PM or after, any day of the week to discuss bone density and talk about issues with cholesterol meds.  Can call patient anytime after 2:30pm. [phone]

## 2021-07-10 NOTE — Telephone Encounter (Signed)
Appointment given for 10/31 @4 :15pm with Dr. .

## 2021-07-11 NOTE — Telephone Encounter (Signed)
Called and spoke to patient to inform that we are waiting on provider to review - we did not get report until yesterday from radiology. Patient has an appointment 07/21/2021.

## 2021-07-21 ENCOUNTER — Ambulatory Visit: Payer: BC Managed Care – PPO | Admitting: Family Medicine

## 2021-07-21 ENCOUNTER — Encounter: Payer: Self-pay | Admitting: Family Medicine

## 2021-07-21 ENCOUNTER — Other Ambulatory Visit: Payer: Self-pay

## 2021-07-21 VITALS — BP 130/84 | HR 67 | Temp 97.7°F | Ht 62.0 in | Wt 170.8 lb

## 2021-07-21 DIAGNOSIS — F424 Excoriation (skin-picking) disorder: Secondary | ICD-10-CM | POA: Diagnosis not present

## 2021-07-21 DIAGNOSIS — L2089 Other atopic dermatitis: Secondary | ICD-10-CM

## 2021-07-21 DIAGNOSIS — E7800 Pure hypercholesterolemia, unspecified: Secondary | ICD-10-CM

## 2021-07-21 DIAGNOSIS — M810 Age-related osteoporosis without current pathological fracture: Secondary | ICD-10-CM

## 2021-07-21 MED ORDER — ROSUVASTATIN CALCIUM 5 MG PO TABS
5.0000 mg | ORAL_TABLET | Freq: Every day | ORAL | 3 refills | Status: DC
Start: 2021-07-21 — End: 2022-06-30

## 2021-07-21 MED ORDER — RISEDRONATE SODIUM 150 MG PO TABS: 150.0000 mg | ORAL_TABLET | ORAL | 4 refills | Status: DC

## 2021-07-21 MED ORDER — TRIAMCINOLONE ACETONIDE 0.1 % EX CREA: 1.0000 "application " | TOPICAL_CREAM | Freq: Two times a day (BID) | CUTANEOUS | 0 refills | Status: DC

## 2021-07-21 NOTE — Progress Notes (Signed)
Subjective: CC: Follow-up DEXA scan PCP: Raliegh Ip, DO [id number] Fullard is a 60 y.o. female presenting to clinic today for:  1.  Age-related osteoporosis Patient is compliant with Fosamax weekly.  She had her DEXA scan performed which showed osteopenic range bone mineral density.  She is compliant with vitamin D and calcium at appropriate doses.  She admits that she does have some acid reflux with the Fosamax and would certainly be willing to go to something else.  She has consider Prolia but it was quite expensive when they tried to improve it before  2.  Hyperlipidemia She reports some joint and muscle pain with the atorvastatin 10 mg.  No abdominal pain or chest pain.  Would like to try something else if possible  3.  Rash She reports recurrent rash on the neck.  Asking for refill triamcinolone.  Admits that she is been picking this area   ROS: Per HPI  Allergies  Allergen Reactions   Ampicillin Rash   Penicillins Hives    All over the body, except the face.   Past Medical History:  Diagnosis Date   Broken arm 08/1966   left   Rectal bleeding    Rectal pain    Thrombosed hemorrhoids     Current Outpatient Medications:    cyclobenzaprine (FLEXERIL) 10 MG tablet, Take 1 tablet (10 mg total) by mouth 3 (three) times daily as needed for muscle spasms., Disp: 30 tablet, Rfl: 2   OVER THE COUNTER MEDICATION, Vitafusion 1000units, Disp: , Rfl:    risedronate (ACTONEL) 150 MG tablet, Take 1 tablet (150 mg total) by mouth every 30 (thirty) days. with water on empty stomach, nothing by mouth or lie down for next 30 minutes., Disp: 3 tablet, Rfl: 4   rosuvastatin (CRESTOR) 5 MG tablet, Take 1 tablet (5 mg total) by mouth daily., Disp: 90 tablet, Rfl: 3   valACYclovir (VALTREX) 500 MG tablet, TAKE 1 TABLET 3 TIMES A DAY FOR 7 DAYS, Disp: 21 tablet, Rfl: 11   triamcinolone cream (KENALOG) 0.1 %, Apply 1 application topically 2 (two) times daily. For up to 7 days per flare,  Disp: 30 g, Rfl: 0 Social History   Socioeconomic History   Marital status: Married    Spouse name: Not on file   Number of children: 0   Years of education: Not on file   Highest education level: Not on file  Occupational History   Not on file  Tobacco Use   Smoking status: Never   Smokeless tobacco: Never  Vaping Use   Vaping Use: Never used  Substance and Sexual Activity   Alcohol use: No   Drug use: No   Sexual activity: Not on file  Other Topics Concern   Not on file  Social History Narrative   Married, no children.  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 BP 130/84   Pulse 67   Temp 97.7 F (36.5 C)   Ht 5\' 2"  (1.575 m)   Wt 170 lb 12.8 oz (77.5 kg)   LMP 10/22/2014   SpO2 98%   BMI 31.24 kg/m   Physical Examination:  General: Awake, alert, well nourished, No acute distress Cardio: regular rate and rhythm, S1S2 heard, no murmurs appreciated Pulm: clear to auscultation bilaterally, no wheezes, rhonchi or rales; normal work of breathing on room air  Assessment/ Plan: 60 y.o. female   Age-related osteoporosis without current pathological fracture - Plan: risedronate (ACTONEL) 150 MG tablet  Other atopic dermatitis - Plan: triamcinolone cream (KENALOG) 0.1 %  Picking own skin  Pure hypercholesterolemia - Plan: rosuvastatin (CRESTOR) 5 MG  tablet  Osteoporosis under moderate good control as her most recent DEXA scan showed osteopenia.  She was advised to continue vitamin D and calcium and she has done this.  Previously treated with Fosamax and took her last dose yesterday.  Wondering if she should continue this or pursue drug holiday.  She has been on this medication since 2018.  I advised her to continue bisphosphonate for 1 more year and then we could consider taking a drug holiday, potentially with no medication at all for at least 6 months given most recent DEXA scan.  Since she is having some uncontrolled acid with the Fosamax we will try switching her off to once monthly.  She will take this at her next expected Fosamax dose instead.  Having a flareup again of the dermatitis on her neck.  Kenalog renewed for as needed use.  Advised against picking skin  Had myalgia with atorvastatin so we will trial Crestor instead.  May need to consider every other day dosing.  Plan to follow-up in 4 months for repeat fasting lipid and CMP  No orders of the defined types were placed in this encounter.  Meds ordered this encounter  Medications   rosuvastatin (CRESTOR) 5 MG tablet    Sig: Take 1 tablet (5 mg total) by mouth daily.    Dispense:  90 tablet    Refill:  3   risedronate (ACTONEL) 150 MG tablet    Sig: Take 1 tablet (150 mg total) by mouth every 30 (thirty) days. with water on empty stomach, nothing by mouth or lie down for next 30 minutes.    Dispense:  3 tablet    Refill:  4   triamcinolone cream (KENALOG) 0.1 %    Sig: Apply 1 application topically 2 (two) times daily. For up to 7 days per flare    Dispense:  30 g    Refill:  0     Adib Wahba 2019, DO [facility] [phone]

## 2021-07-22 ENCOUNTER — Telehealth: Payer: Self-pay | Admitting: *Deleted

## 2021-07-22 NOTE — Telephone Encounter (Signed)
Hoorain Fosse Key: [id number] - Rx #: [id number] Need help? Call us at [phone] Status Additional Information Required Drug Risedronate Sodium 150MG  tablets Form [employer] [place] Commercial Electronic Request Form (CB) Original Claim Info 70 NOT COVERED; ALENDRONATE PREFERREDPRODUCT OR SERVICE NOT COVERED. PLEASECONTACT [id number]:  Sent to plan 07/22/21

## 2021-07-25 ENCOUNTER — Telehealth: Payer: Self-pay | Admitting: Family Medicine

## 2021-07-25 NOTE — Telephone Encounter (Signed)
Denied - ALENDRONATE PREFERRED PRODUCT

## 2021-07-28 NOTE — Telephone Encounter (Signed)
Looks like fosamax, which she was on previously, is preferred.  Will await Prolia info

## 2021-07-30 NOTE — Telephone Encounter (Signed)
Will send verification of benefits to [employer]

## 2021-07-30 NOTE — Telephone Encounter (Signed)
Pt calling back to check on prior auth for two medicines. Please call back and advise.

## 2021-08-01 ENCOUNTER — Encounter: Payer: Self-pay | Admitting: Family Medicine

## 2021-08-04 NOTE — Telephone Encounter (Signed)
Can someone reach out to patient?

## 2021-10-02 ENCOUNTER — Other Ambulatory Visit: Payer: Self-pay | Admitting: Obstetrics

## 2021-10-02 DIAGNOSIS — Z1231 Encounter for screening mammogram for malignant neoplasm of breast: Secondary | ICD-10-CM

## 2021-11-18 ENCOUNTER — Encounter: Payer: Self-pay | Admitting: Family Medicine

## 2021-11-18 ENCOUNTER — Ambulatory Visit: Payer: BC Managed Care – PPO | Admitting: Family Medicine

## 2021-11-18 VITALS — BP 114/78 | HR 75 | Temp 97.2°F | Ht 62.0 in | Wt 174.6 lb

## 2021-11-18 DIAGNOSIS — B029 Zoster without complications: Secondary | ICD-10-CM

## 2021-11-18 DIAGNOSIS — L304 Erythema intertrigo: Secondary | ICD-10-CM

## 2021-11-18 DIAGNOSIS — E7800 Pure hypercholesterolemia, unspecified: Secondary | ICD-10-CM

## 2021-11-18 DIAGNOSIS — M810 Age-related osteoporosis without current pathological fracture: Secondary | ICD-10-CM | POA: Diagnosis not present

## 2021-11-18 MED ORDER — METHYLPREDNISOLONE ACETATE 40 MG/ML IJ SUSP
40.0000 mg | Freq: Once | INTRAMUSCULAR | Status: AC
  Administered 2021-11-18: 40 mg via INTRAMUSCULAR

## 2021-11-18 MED ORDER — NYSTATIN-TRIAMCINOLONE 100000-0.1 UNIT/GM-% EX OINT: 1.0000 "application " | TOPICAL_OINTMENT | Freq: Two times a day (BID) | CUTANEOUS | 0 refills | Status: DC

## 2021-11-18 NOTE — Progress Notes (Signed)
Subjective: QB:HALP PCP: Janora Norlander, DO FXT:KWIOX Erika Ray is a 61 y.o. female presenting to clinic today for:  1.  Osteoporosis Patient will be 5 years on bisphosphonates this July.  She is wondering what we will be doing next for treatment since she will need to take a drug holiday.  She had previously considered Prolia but it was very costly a few years ago.  She is willing to try pursuing this again.  2.  Hyperlipidemia Compliant with Crestor.  She is fasting today.  No chest pain or shortness of breath reported  3.  Rash Patient reports rash on lower abdomen.  She is use CeraVe, multiple OTC creams with no significant improvement in symptoms.  Also tried Zyrtec.  She reports rash is itchy.   ROS: Per HPI  Allergies  Allergen Reactions   Ampicillin Rash   Penicillins Hives    All over the body, except the face.   Past Medical History:  Diagnosis Date   Broken arm 08/1966   left   Rectal bleeding    Rectal pain    Thrombosed hemorrhoids     Current Outpatient Medications:    Calcium Carb-Cholecalciferol (CALCIUM 500+D) 500-10 MG-MCG TABS, Take by mouth., Disp: , Rfl:    risedronate (ACTONEL) 150 MG tablet, Take 1 tablet (150 mg total) by mouth every 30 (thirty) days. with water on empty stomach, nothing by mouth or lie down for next 30 minutes., Disp: 3 tablet, Rfl: 4   rosuvastatin (CRESTOR) 5 MG tablet, Take 1 tablet (5 mg total) by mouth daily., Disp: 90 tablet, Rfl: 3   triamcinolone cream (KENALOG) 0.1 %, Apply 1 application topically 2 (two) times daily. For up to 7 days per flare, Disp: 30 g, Rfl: 0   valACYclovir (VALTREX) 500 MG tablet, TAKE 1 TABLET 3 TIMES A DAY FOR 7 DAYS, Disp: 21 tablet, Rfl: 11 Social History   Socioeconomic History   Marital status: Married    Spouse name: Not on file   Number of children: 0   Years of education: Not on file   Highest education level: Not on file  Occupational History   Not on file  Tobacco Use   Smoking  status: Never   Smokeless tobacco: Never  Vaping Use   Vaping Use: Never used  Substance and Sexual Activity   Alcohol use: No   Drug use: No   Sexual activity: Not on file  Other Topics Concern   Not on file  Social History Narrative   Married, no children.  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   Works for [employer] Strain: Not on [employer] Insecurity: Not on file  Transportation Needs: Not on file  Physical Activity: Not on file  Stress: Not on file  Social Connections: Not on file  Intimate Partner Violence: Not on file   Family History  Problem Relation Age of Onset   Cancer Mother 71       ovarian   Heart disease Father 25   COPD Father    Cancer Maternal Grandmother 88       breast   Thyroid disease Maternal Grandmother    Thyroid disease Maternal Aunt    Thyroid disease Maternal Uncle     Objective: Office vital signs reviewed. BP 114/78    Pulse 75    Temp (!) 97.2 F (36.2 C)    Ht 5' 2"  (1.575 m)  Wt 174 lb 9.6 oz (79.2 kg)    LMP 10/22/2014    SpO2 97%    BMI 31.93 kg/m   Physical Examination:  General: Awake, alert, well nourished, No acute distress HEENT sclera white.  Moist mucous membranes Cardio: regular rate and rhythm, S1S2 heard, no murmurs appreciated Pulm: clear to auscultation bilaterally, no wheezes, rhonchi or rales; normal work of breathing on room air Skin: Scaly, erythematous and warm rash appreciated along the inferior aspect of the abdomen just above the mons pubis  Assessment/ Plan: 61 y.o. female   Age-related osteoporosis without current pathological fracture - Plan: VITAMIN D 25 Hydroxy (Vit-D Deficiency, Fractures)  Pure hypercholesterolemia - Plan: Lipid Panel, CMP14+EGFR, TSH  Intertrigo - Plan: nystatin-triamcinolone ointment (MYCOLOG), methylPREDNISolone acetate (DEPO-MEDROL) injection 40 mg  Plan to transition over to Prolia versus only use vitamin D and calcium when  she meets her 5-year treatment with bisphosphonate in July.  We will need to give her a drug holiday at least for a couple of months.  I have CCed Prolia pool to see if this is a reasonable/cost effective treatment Check vitamin D level, CMP  Fasting lipid, CMP and TSH ordered.  Continue statin  Skin rash consistent with intertrigo.  Nystatin triamcinolone cream provided.  Okay to continue moisturization, Zyrtec.  Depo-Medrol intramuscularly today to help with irritation.  She will follow-up in 1 week if symptoms or not improving   No orders of the defined types were placed in this encounter.  No orders of the defined types were placed in this encounter.    Janora Norlander, DO [place] [phone]

## 2021-11-18 NOTE — Patient Instructions (Signed)
Intertrigo °Intertrigo is skin irritation (inflammation) that happens in warm, moist areas of the body. The irritation can cause a rash and make skin raw and itchy. The rash is usually pink or red. It happens mostly between folds of skin or where skin rubs together, such as: °Between the toes. °In the armpits. °In the groin area. °Under the belly. °Under the breasts. °Around the butt area. °This condition is not passed from person to person (is not contagious). °What are the causes? °Heat, moisture, rubbing, and not enough air movement. °The condition can be made worse by: °Sweat. °Bacteria. °A fungus, such as yeast. °What increases the risk? °Moisture in your skin folds. °You are more likely to develop this condition if you: °Have diabetes. °Are overweight. °Are not able to move around. °Live in a warm and moist climate. °Wear splints, braces, or other medical devices. °Are not able to control your pee (urine) or poop (stool). °What are the signs or symptoms? °A pink or red skin rash in the skin fold or near the skin fold. °Raw or scaly skin. °Itching. °A burning feeling. °Bleeding. °Leaking fluid. °A bad smell. °How is this treated? °Cleaning and drying your skin. °Taking an antibiotic medicine or using an antibiotic skin cream for a bacterial infection. °Using an antifungal cream on your skin or taking pills for an infection that was caused by a fungus, such as yeast. °Using a steroid ointment to stop the itching and irritation. °Separating the skin fold with a clean cotton cloth to absorb moisture and allow air to flow into the area. °Follow these instructions at home: °Keep the affected area clean and dry. °Do not scratch your skin. °Stay cool as much as you can. Use an air conditioner or a fan, if you have one. °Apply over-the-counter and prescription medicines only as told by your doctor. °If you were prescribed an antibiotic medicine, use it as told by your doctor. Do not stop using the antibiotic even if  your condition starts to get better. °Keep all follow-up visits as told by your doctor. This is important. °How is this prevented? ° °Stay at a healthy weight. °Take care of your feet. This is very important if you have diabetes. You should: °Wear shoes that fit well. °Keep your feet dry. °Wear clean cotton or wool socks. °Protect the skin in your groin and butt area as told by your doctor. To do this: °Follow a regular cleaning routine. °Use creams, powders, or ointments that protect your skin. °Change protection pads often. °Do not wear tight clothes. Wear clothes that: °Are loose. °Take moisture away from your body. °Are made of cotton. °Wear a bra that gives good support, if needed. °Shower and dry yourself well after being active. Use a hair dryer on a cool setting to dry between skin folds. °Keep your blood sugar under control if you have diabetes. °Contact a doctor if: °Your symptoms do not get better with treatment. °Your symptoms get worse or they spread. °You notice more redness and warmth. °You have a fever. °Summary °Intertrigo is skin irritation that occurs when folds of skin rub together. °This condition is caused by heat, moisture, and rubbing. °This condition may be treated by cleaning and drying your skin and with medicines. °Apply over-the-counter and prescription medicines only as told by your doctor. °Keep all follow-up visits as told by your doctor. This is important. °This information is not intended to replace advice given to you by your health care provider. Make sure you discuss   any questions you have with your health care provider. °Document Revised: 06/16/2018 Document Reviewed: 06/16/2018 °Elsevier Patient Education © 2022 Elsevier Inc. ° °

## 2021-11-19 LAB — CMP14+EGFR
ALT: 17 IU/L (ref 0–32)
AST: 25 IU/L (ref 0–40)
Albumin/Globulin Ratio: 2.2 (ref 1.2–2.2)
Albumin: 4.6 g/dL (ref 3.8–4.9)
Alkaline Phosphatase: 84 IU/L (ref 44–121)
BUN/Creatinine Ratio: 13 (ref 9–23)
BUN: 10 mg/dL (ref 6–24)
Bilirubin Total: 0.5 mg/dL (ref 0.0–1.2)
CO2: 25 mmol/L (ref 20–29)
Calcium: 9.7 mg/dL (ref 8.7–10.2)
Chloride: 100 mmol/L (ref 96–106)
Creatinine, Ser: 0.76 mg/dL (ref 0.57–1.00)
Globulin, Total: 2.1 g/dL (ref 1.5–4.5)
Glucose: 75 mg/dL (ref 70–99)
Potassium: 3.8 mmol/L (ref 3.5–5.2)
Sodium: 139 mmol/L (ref 134–144)
Total Protein: 6.7 g/dL (ref 6.0–8.5)
eGFR: 90 mL/min/{1.73_m2} (ref >59)

## 2021-11-19 LAB — TSH: TSH: 3.1 u[IU]/mL (ref 0.450–4.500)

## 2021-11-19 LAB — LIPID PANEL
Chol/HDL Ratio: 2.3 ratio (ref 0.0–4.4)
Cholesterol, Total: 183 mg/dL (ref 100–199)
HDL: 81 mg/dL (ref >39)
LDL Chol Calc (NIH): 88 mg/dL (ref 0–99)
Triglycerides: 77 mg/dL (ref 0–149)
VLDL Cholesterol Cal: 14 mg/dL (ref 5–40)

## 2021-11-19 LAB — VITAMIN D 25 HYDROXY (VIT D DEFICIENCY, FRACTURES): Vit D, 25-Hydroxy: 39.9 ng/mL (ref 30.0–100.0)

## 2021-11-20 ENCOUNTER — Telehealth: Payer: Self-pay | Admitting: Family Medicine

## 2021-11-20 MED ORDER — VALACYCLOVIR HCL 500 MG PO TABS: ORAL_TABLET | ORAL | 11 refills | Status: DC

## 2021-11-20 NOTE — Addendum Note (Signed)
Addended by: Raliegh Ip on: 11/20/2021 04:25 PM   Modules accepted: Orders

## 2021-11-20 NOTE — Telephone Encounter (Signed)
PT AWARE  

## 2021-11-21 ENCOUNTER — Telehealth: Payer: Self-pay | Admitting: *Deleted

## 2021-11-21 DIAGNOSIS — M810 Age-related osteoporosis without current pathological fracture: Secondary | ICD-10-CM

## 2021-11-21 NOTE — Telephone Encounter (Signed)
Jimmi Bagot Key: [id number] ? ?Sent to Plan today ?Drug ?Prolia 60MG /ML syringes ? ?Form ?[employer] [place] [employer] Benefit Electronic Request Form (CB) ?

## 2021-11-24 NOTE — Telephone Encounter (Signed)
Absolutely.

## 2021-11-24 NOTE — Telephone Encounter (Signed)
Patient has been approved for Prolia but she just bought her actonel to take on the 13th. Can she take that this month then we start Prolia in April?  ?

## 2021-11-24 NOTE — Telephone Encounter (Signed)
Patient aware.

## 2021-11-24 NOTE — Telephone Encounter (Signed)
Prolia ordered and appointment scheduled for 01/06/22. ?

## 2021-12-05 ENCOUNTER — Encounter: Payer: Self-pay | Admitting: *Deleted

## 2021-12-24 ENCOUNTER — Ambulatory Visit
Admission: RE | Admit: 2021-12-24 | Discharge: 2021-12-24 | Disposition: A | Discharge status: Home / Self Care | Payer: BC Managed Care – PPO | Source: Ambulatory Visit | Attending: Obstetrics | Admitting: Obstetrics

## 2021-12-24 DIAGNOSIS — Z1231 Encounter for screening mammogram for malignant neoplasm of breast: Secondary | ICD-10-CM

## 2022-01-06 ENCOUNTER — Ambulatory Visit (INDEPENDENT_AMBULATORY_CARE_PROVIDER_SITE_OTHER): Payer: BC Managed Care – PPO | Admitting: Emergency Medicine

## 2022-01-06 DIAGNOSIS — M810 Age-related osteoporosis without current pathological fracture: Secondary | ICD-10-CM | POA: Diagnosis not present

## 2022-01-06 MED ORDER — DENOSUMAB 60 MG/ML ~~LOC~~ SOSY
60.0000 mg | PREFILLED_SYRINGE | Freq: Once | SUBCUTANEOUS | Status: AC
  Administered 2022-01-06: 60 mg via SUBCUTANEOUS

## 2022-01-06 NOTE — Progress Notes (Signed)
Patient presents for Prolia injection. Patient tolerated well.  ?Injection given in R upper arm  ?

## 2022-01-13 ENCOUNTER — Encounter: Payer: Self-pay | Admitting: Nurse Practitioner

## 2022-01-13 ENCOUNTER — Ambulatory Visit: Payer: BC Managed Care – PPO | Admitting: Nurse Practitioner

## 2022-01-13 VITALS — BP 105/73 | HR 76 | Temp 97.8°F | Resp 20 | Ht 62.0 in | Wt 169.0 lb

## 2022-01-13 DIAGNOSIS — M5431 Sciatica, right side: Secondary | ICD-10-CM

## 2022-01-13 MED ORDER — PREDNISONE 20 MG PO TABS: 40.0000 mg | ORAL_TABLET | Freq: Every day | ORAL | 0 refills | Status: AC

## 2022-01-13 MED ORDER — METHYLPREDNISOLONE ACETATE 40 MG/ML IJ SUSP
80.0000 mg | Freq: Once | INTRAMUSCULAR | Status: AC
  Administered 2022-01-13: 80 mg via INTRAMUSCULAR

## 2022-01-13 MED ORDER — KETOROLAC TROMETHAMINE 60 MG/2ML IM SOLN
60.0000 mg | Freq: Once | INTRAMUSCULAR | Status: AC
  Administered 2022-01-13: 60 mg via INTRAMUSCULAR

## 2022-01-13 MED ORDER — METHYLPREDNISOLONE ACETATE 80 MG/ML IJ SUSP: 80.0000 mg | Freq: Once | INTRAMUSCULAR | Status: DC

## 2022-01-13 NOTE — Progress Notes (Signed)
? ?  Subjective:  ? ? Patient ID: Erika Ray, female    DOB: 07-Feb-1961, 61 y.o.   MRN: [id number] ? ? ?Chief Complaint: Hip Pain (Right side/) ? ? ?Hip Pain  ? ?Patient come sin today c/o right lower back pain and radiates into her butt and down the top part of her right leg. Started 1 week ago. Rates pain 7/10. Has gotten slightly better since yesterday. Walking increases pain. Rest makes it some better.  ? ? ? ?Review of Systems  ?Musculoskeletal:  Positive for arthralgias (right hip) and back pain.  ? ?   ?Objective:  ? Physical Exam ?Vitals reviewed.  ?Constitutional:   ?   Appearance: Normal appearance.  ?Cardiovascular:  ?   Rate and Rhythm: Normal rate and regular rhythm.  ?   Heart sounds: Normal heart sounds.  ?Neurological:  ?   Mental Status: She is alert.  ? ? ?BP 105/73   Pulse 76   Temp 97.8 ?F (36.6 ?C) (Temporal)   Resp 20   Ht 5\' 2"  (1.575 m)   Wt 169 lb (76.7 kg)   LMP 10/22/2014   SpO2 99%   BMI 30.91 kg/m?  ? ? ? ?   ?Assessment & Plan:  ?Erika Ray in today with chief complaint of Hip Pain (Right side/) ? ? ?1. Sciatica of right side ?Moist heat ?Rest ?RTO prn ?- ketorolac (TORADOL) injection 60 mg ?- methylPREDNISolone acetate (DEPO-MEDROL) injection 80 mg ?- predniSONE (DELTASONE) 20 MG tablet; Take 2 tablets (40 mg total) by mouth daily with breakfast for 5 days. 2 po daily for 5 days  Dispense: 10 tablet; Refill: 0 ? ? ? ?The above assessment and management plan was discussed with the patient. The patient verbalized understanding of and has agreed to the management plan. Patient is aware to call the clinic if symptoms persist or worsen. Patient is aware when to return to the clinic for a follow-up visit. Patient educated on when it is appropriate to go to the emergency department.  ? ?Erika Ray Bachelor, FNP ? ? ? ?

## 2022-01-13 NOTE — Patient Instructions (Signed)

## 2022-01-16 DIAGNOSIS — Z6830 Body mass index (BMI) 30.0-30.9, adult: Secondary | ICD-10-CM | POA: Diagnosis not present

## 2022-01-16 DIAGNOSIS — Z01419 Encounter for gynecological examination (general) (routine) without abnormal findings: Secondary | ICD-10-CM | POA: Diagnosis not present

## 2022-04-09 ENCOUNTER — Encounter: Payer: BC Managed Care – PPO | Admitting: Family Medicine

## 2022-04-10 ENCOUNTER — Ambulatory Visit (INDEPENDENT_AMBULATORY_CARE_PROVIDER_SITE_OTHER): Payer: BC Managed Care – PPO | Admitting: Family Medicine

## 2022-04-10 ENCOUNTER — Encounter: Payer: Self-pay | Admitting: Family Medicine

## 2022-04-10 VITALS — BP 132/84 | HR 71 | Temp 97.1°F | Ht 62.0 in | Wt 167.2 lb

## 2022-04-10 DIAGNOSIS — E669 Obesity, unspecified: Secondary | ICD-10-CM

## 2022-04-10 DIAGNOSIS — M810 Age-related osteoporosis without current pathological fracture: Secondary | ICD-10-CM | POA: Diagnosis not present

## 2022-04-10 DIAGNOSIS — E200 Idiopathic hypoparathyroidism: Secondary | ICD-10-CM

## 2022-04-10 DIAGNOSIS — Z713 Dietary counseling and surveillance: Secondary | ICD-10-CM

## 2022-04-10 DIAGNOSIS — Z0001 Encounter for general adult medical examination with abnormal findings: Secondary | ICD-10-CM

## 2022-04-10 DIAGNOSIS — E7800 Pure hypercholesterolemia, unspecified: Secondary | ICD-10-CM | POA: Diagnosis not present

## 2022-04-10 DIAGNOSIS — Z130 Encounter for screening for diseases of the blood and blood-forming organs and certain disorders involving the immune mechanism: Secondary | ICD-10-CM | POA: Diagnosis not present

## 2022-04-10 NOTE — Progress Notes (Signed)
Erika Ray is a 61 y.o. female presents to office today for annual physical exam examination.    Concerns today include: 1.  Obesity Patient reports that she has really been struggling with her weight despite diet modification.  She is interested in pursuing a GLP because there is a family history of chronic disease associated with obesity.  She would really like to avoid these types of complications.  No personal or family history of medullary thyroid cancer, multiple endocrine type II neoplasia or pancreatitis.  She is not sure if her insurance will cover any of the medications.  However she notes that she is willing to pay in efforts to improve her health so she will discuss with her husband  Marital status: Married, Substance use: None Diet: Typical American, Exercise: No structured reported currently Last colonoscopy: Up-to-date Last mammogram: Up-to-date Last pap smear: Up-to-date Refills needed today: All Immunizations needed: Immunization History  Administered Date(s) Administered   Influenza,inj,Quad PF,6+ Mos 09/02/2015, 07/20/2017, 08/12/2018, 07/12/2020, 07/01/2021   Influenza,inj,quad, With Preservative 06/19/2019   Influenza-Unspecified 06/09/2019   PFIZER(Purple Top)SARS-COV-2 Vaccination 12/06/2019, 12/27/2019, 08/08/2020, 01/23/2021, 07/16/2021   Zoster Recombinat (Shingrix) 02/04/2017, 04/29/2017     Past Medical History:  Diagnosis Date   Broken arm 08/1966   left   Rectal bleeding    Rectal pain    Thrombosed hemorrhoids    Social History   Socioeconomic History   Marital status: Married    Spouse name: Not on file   Number of children: 0   Years of education: Not on file   Highest education level: Not on file  Occupational History   Not on file  Tobacco Use   Smoking status: Never   Smokeless tobacco: Never  Vaping Use   Vaping Use: Never used  Substance and Sexual Activity   Alcohol use: No   Drug use: No   Sexual activity: Not on file   Other Topics Concern   Not on file  Social History Narrative   Married, no children.  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   Works for [employer] Strain: Not on [employer] Insecurity: Not on file  Transportation Needs: Not on file  Physical Activity: Not on file  Stress: Not on file  Social Connections: Not on file  Intimate Partner Violence: Not on file   Past Surgical History:  Procedure Laterality Date   [place]   left arm   HEMORROIDECTOMY  06/11/11   thromb hems   Family History  Problem Relation Age of Onset   Cancer Mother 39       ovarian   Heart disease Father 39   COPD Father    Cancer Maternal Grandmother 88       breast   Thyroid disease Maternal Grandmother    Thyroid disease Maternal Aunt    Thyroid disease Maternal Uncle     Current Outpatient Medications:    Calcium-Magnesium-Vitamin D (CALCIUM 1200+D3 PO), Take by mouth., Disp: , Rfl:    nystatin-triamcinolone ointment (MYCOLOG), Apply 1 application topically 2 (two) times daily. X7-10 days, Disp: 30 g, Rfl: 0   rosuvastatin (CRESTOR) 5 MG tablet, Take 1 tablet (5 mg total) by mouth daily., Disp: 90 tablet, Rfl: 3   triamcinolone cream (KENALOG) 0.1 %, Apply 1 application topically 2 (two) times daily. For up to 7 days per flare, Disp: 30 g, Rfl: 0   valACYclovir (VALTREX) 500 MG tablet,  TAKE 1 TABLET 3 TIMES A DAY FOR 7 DAYS, Disp: 21 tablet, Rfl: 11  Allergies  Allergen Reactions   Ampicillin Rash   Penicillins Hives    All over the body, except the face.     ROS: Review of Systems A comprehensive review of systems was negative except for: Integument/breast: positive for scrape on the right lower leg.    Physical exam BP 132/84   Pulse 71   Temp (!) 97.1 F (36.2 C)   Ht _0  (1.575 m)   Wt 167 lb 3.2 oz (75.8 kg)   LMP 10/22/2014   SpO2 97%   BMI 30.58 kg/m  General appearance: alert, cooperative, appears stated age,  and no distress Head: Normocephalic, without obvious abnormality, atraumatic Eyes: negative findings: lids and lashes normal, conjunctivae and sclerae normal, corneas clear, and pupils equal, round, reactive to light and accomodation Ears: normal TM's and external ear canals both ears Nose: Nares normal. Septum midline. Mucosa normal. No drainage or sinus tenderness. Throat: lips, mucosa, and tongue normal; teeth and gums normal Neck: no adenopathy, no carotid bruit, supple, symmetrical, trachea midline, and thyroid not enlarged, symmetric, no tenderness/mass/nodules Back: symmetric, no curvature. ROM normal. No CVA tenderness. Lungs: clear to auscultation bilaterally Heart: regular rate and rhythm, S1, S2 normal, no murmur, click, rub or gallop Abdomen: soft, non-tender; bowel sounds normal; no masses,  no organomegaly Extremities: extremities normal, atraumatic, no cyanosis or edema Pulses: 2+ and symmetric Skin:  Small healing abrasion along the right anterior lower leg.  No evidence of secondary bacterial infection.  Hemostatic. Lymph nodes: Cervical, supraclavicular, and axillary nodes normal. Neurologic: Grossly normal Psych: Mood stable, speech normal, affect appropriate  Estimated body mass index is 30.58 kg/m as calculated from the following:   Height as of this encounter: _1  (1.575 m).   Weight as of this encounter: 167 lb 3.2 oz (75.8 kg).     04/10/2022    3:42 PM 01/13/2022    8:04 AM 11/18/2021    2:43 PM  Depression screen PHQ 2/9  Decreased Interest 0 0 0  Down, Depressed, Hopeless 0 0 0  PHQ - 2 Score 0 0 0  Altered sleeping  0   Tired, decreased energy  0   Change in appetite  0   Feeling bad or failure about yourself   0   Trouble concentrating  0   Moving slowly or fidgety/restless  0   Suicidal thoughts  0   PHQ-9 Score  0   Difficult doing work/chores  Not difficult at all     Assessment/ Plan: [name] here for annual physical exam.   Pure  hypercholesterolemia - Plan: CMP14+EGFR, Lipid Panel, TSH  Age-related osteoporosis without current pathological fracture - Plan: CMP14+EGFR, VITAMIN D 25 Hydroxy (Vit-D Deficiency, Fractures)  Idiopathic hypoparathyroidism (HCC)  Screening, anemia, deficiency, iron - Plan: CBC with Differential  Weight loss counseling, encounter for  Obesity (BMI 30.0-34.9)  Fasting labs collected again today per her request.  Her work form was completed today.  I counseled her on options for weight loss including Wegovy, Saxenda and semaglutide combined with a B12 at our local compounding pharmacy.  She has no apparent contraindications to any of these medications and will check into her pharmacy benefits as to what might be covered.  She will contact me back with this and we will plan to follow-up in the next 3 to 4 months for interval weight check if appropriate.  Counseled on healthy  lifestyle choices, including diet (rich in fruits, vegetables and lean meats and low in salt and simple carbohydrates) and exercise (at least 30 minutes of moderate physical activity daily).   Karista Aispuro M. Lajuana Ripple, DO

## 2022-04-11 LAB — CMP14+EGFR
ALT: 13 IU/L (ref 0–32)
AST: 21 IU/L (ref 0–40)
Albumin/Globulin Ratio: 2.3 — ABNORMAL HIGH (ref 1.2–2.2)
Albumin: 4.5 g/dL (ref 3.8–4.9)
Alkaline Phosphatase: 71 IU/L (ref 44–121)
BUN/Creatinine Ratio: 21 (ref 12–28)
BUN: 14 mg/dL (ref 8–27)
Bilirubin Total: 0.6 mg/dL (ref 0.0–1.2)
CO2: 23 mmol/L (ref 20–29)
Calcium: 9.8 mg/dL (ref 8.7–10.3)
Chloride: 100 mmol/L (ref 96–106)
Creatinine, Ser: 0.68 mg/dL (ref 0.57–1.00)
Globulin, Total: 2 g/dL (ref 1.5–4.5)
Glucose: 76 mg/dL (ref 70–99)
Potassium: 3.8 mmol/L (ref 3.5–5.2)
Sodium: 138 mmol/L (ref 134–144)
Total Protein: 6.5 g/dL (ref 6.0–8.5)
eGFR: 100 mL/min/{1.73_m2} (ref >59)

## 2022-04-11 LAB — CBC WITH DIFFERENTIAL/PLATELET
Basophils Absolute: 0.1 10*3/uL (ref 0.0–0.2)
Basos: 1 %
EOS (ABSOLUTE): 0.1 10*3/uL (ref 0.0–0.4)
Eos: 1 %
Hematocrit: 36.7 % (ref 34.0–46.6)
Hemoglobin: 12.8 g/dL (ref 11.1–15.9)
Immature Grans (Abs): 0 10*3/uL (ref 0.0–0.1)
Immature Granulocytes: 0 %
Lymphocytes Absolute: 2.1 10*3/uL (ref 0.7–3.1)
Lymphs: 23 %
MCH: 30.5 pg (ref 26.6–33.0)
MCHC: 34.9 g/dL (ref 31.5–35.7)
MCV: 88 fL (ref 79–97)
Monocytes Absolute: 0.7 10*3/uL (ref 0.1–0.9)
Monocytes: 8 %
Neutrophils Absolute: 6.2 10*3/uL (ref 1.4–7.0)
Neutrophils: 67 %
Platelets: 217 10*3/uL (ref 150–450)
RBC: 4.19 x10E6/uL (ref 3.77–5.28)
RDW: 11.9 % (ref 11.7–15.4)
WBC: 9.2 10*3/uL (ref 3.4–10.8)

## 2022-04-11 LAB — LIPID PANEL
Chol/HDL Ratio: 2.1 ratio (ref 0.0–4.4)
Cholesterol, Total: 164 mg/dL (ref 100–199)
HDL: 80 mg/dL (ref >39)
LDL Chol Calc (NIH): 71 mg/dL (ref 0–99)
Triglycerides: 67 mg/dL (ref 0–149)
VLDL Cholesterol Cal: 13 mg/dL (ref 5–40)

## 2022-04-11 LAB — VITAMIN D 25 HYDROXY (VIT D DEFICIENCY, FRACTURES): Vit D, 25-Hydroxy: 52.8 ng/mL (ref 30.0–100.0)

## 2022-04-11 LAB — TSH: TSH: 2.25 u[IU]/mL (ref 0.450–4.500)

## 2022-05-13 ENCOUNTER — Ambulatory Visit (INDEPENDENT_AMBULATORY_CARE_PROVIDER_SITE_OTHER): Payer: BC Managed Care – PPO | Admitting: Nurse Practitioner

## 2022-05-13 ENCOUNTER — Encounter: Payer: Self-pay | Admitting: Family Medicine

## 2022-05-13 ENCOUNTER — Encounter: Payer: Self-pay | Admitting: Nurse Practitioner

## 2022-05-13 ENCOUNTER — Telehealth: Payer: Self-pay | Admitting: Family Medicine

## 2022-05-13 DIAGNOSIS — J069 Acute upper respiratory infection, unspecified: Secondary | ICD-10-CM | POA: Diagnosis not present

## 2022-05-13 MED ORDER — BENZONATATE 100 MG PO CAPS: 100.0000 mg | ORAL_CAPSULE | Freq: Three times a day (TID) | ORAL | 0 refills | Status: DC | PRN

## 2022-05-13 MED ORDER — ACETAMINOPHEN 500 MG PO TABS: 500.0000 mg | ORAL_TABLET | Freq: Four times a day (QID) | ORAL | 0 refills | Status: DC | PRN

## 2022-05-13 MED ORDER — GUAIFENESIN ER 600 MG PO TB12: 600.0000 mg | ORAL_TABLET | Freq: Two times a day (BID) | ORAL | 0 refills | Status: DC

## 2022-05-13 NOTE — Telephone Encounter (Signed)
Called to collect co pay - will pay when she comes in on 9/18

## 2022-05-13 NOTE — Patient Instructions (Signed)

## 2022-05-13 NOTE — Progress Notes (Signed)
   Virtual Visit  Note Due to COVID-19 pandemic this visit was conducted virtually. This visit type was conducted due to national recommendations for restrictions regarding the COVID-19 Pandemic (e.g. social distancing, sheltering in place) in an effort to limit this patient's exposure and mitigate transmission in our community. All issues noted in this document were discussed and addressed.  A physical exam was not performed with this format.  I connected with Erika Ray on 05/13/22 at 1:00 pm by telephone and verified that I am speaking with the correct person using two identifiers. Erika Ray is currently located at home during visit. The provider, Daryll Drown, NP is located in their office at time of visit.  I discussed the limitations, risks, security and privacy concerns of performing an evaluation and management service by telephone and the availability of in person appointments. I also discussed with the patient that there may be a patient responsible charge related to this service. The patient expressed understanding and agreed to proceed.   History and Present Illness:  URI  This is a new problem. The current episode started yesterday. The problem has been unchanged. There has been no fever. Associated symptoms include congestion, coughing and headaches. Pertinent negatives include no diarrhea, dysuria, nausea or rash. She has tried nothing for the symptoms.      Review of Systems  Constitutional: Negative.   HENT:  Positive for congestion.   Eyes: Negative.   Respiratory:  Positive for cough.   Cardiovascular: Negative.   Gastrointestinal:  Negative for diarrhea and nausea.  Genitourinary: Negative.  Negative for dysuria.  Musculoskeletal: Negative.  Negative for myalgias.  Skin: Negative.  Negative for rash.  Neurological:  Positive for headaches.  All other systems reviewed and are negative.    Observations/Objective: Telemetry visit patient not in  distress  Assessment and Plan: Patient presents with upper respiratory infection exposed to COVID-19. Provided education to patient to  Take meds as prescribed - Use a cool mist humidifier  -Use saline nose sprays frequently -Force fluids -Benzonatate/guaifenesin for cough and congestion. -For fever or aches or pains- take Tylenol or ibuprofen. -Completed COVID-19, RSV, flu swab results pending.    Follow Up Instructions: Follow up with worsening unresolved symptoms     I discussed the assessment and treatment plan with the patient. The patient was provided an opportunity to ask questions and all were answered. The patient agreed with the plan and demonstrated an understanding of the instructions.   The patient was advised to call back or seek an in-person evaluation if the symptoms worsen or if the condition fails to improve as anticipated.  The above assessment and management plan was discussed with the patient. The patient verbalized understanding of and has agreed to the management plan. Patient is aware to call the clinic if symptoms persist or worsen. Patient is aware when to return to the clinic for a follow-up visit. Patient educated on when it is appropriate to go to the emergency department.   Time call ended: 1:11 PM  I provided 11  minutes of  non face-to-face time during this encounter.    Daryll Drown, NP

## 2022-05-14 ENCOUNTER — Encounter: Payer: Self-pay | Admitting: Nurse Practitioner

## 2022-05-14 LAB — COVID-19, FLU A+B AND RSV
Influenza A, NAA: NOT DETECTED
Influenza B, NAA: NOT DETECTED
RSV, NAA: NOT DETECTED
SARS-CoV-2, NAA: DETECTED — AB

## 2022-05-14 MED ORDER — MOLNUPIRAVIR EUA 200MG CAPSULE: 4.0000 | ORAL_CAPSULE | Freq: Two times a day (BID) | ORAL | 0 refills | Status: AC

## 2022-05-15 ENCOUNTER — Encounter: Payer: Self-pay | Admitting: Nurse Practitioner

## 2022-06-04 ENCOUNTER — Ambulatory Visit: Payer: BC Managed Care – PPO | Admitting: Family Medicine

## 2022-06-08 ENCOUNTER — Ambulatory Visit: Payer: BC Managed Care – PPO | Admitting: Family Medicine

## 2022-06-08 ENCOUNTER — Encounter: Payer: Self-pay | Admitting: Family Medicine

## 2022-06-08 VITALS — BP 121/78 | HR 68 | Temp 97.1°F | Ht 62.0 in | Wt 167.8 lb

## 2022-06-08 DIAGNOSIS — L659 Nonscarring hair loss, unspecified: Secondary | ICD-10-CM

## 2022-06-08 DIAGNOSIS — E7800 Pure hypercholesterolemia, unspecified: Secondary | ICD-10-CM | POA: Diagnosis not present

## 2022-06-08 DIAGNOSIS — Z23 Encounter for immunization: Secondary | ICD-10-CM

## 2022-06-08 DIAGNOSIS — Z8616 Personal history of COVID-19: Secondary | ICD-10-CM

## 2022-06-08 DIAGNOSIS — E669 Obesity, unspecified: Secondary | ICD-10-CM

## 2022-06-08 MED ORDER — SEMAGLUTIDE (1 MG/DOSE) 4 MG/3ML ~~LOC~~ SOPN: PEN_INJECTOR | SUBCUTANEOUS | 0 refills | Status: DC

## 2022-06-08 NOTE — Patient Instructions (Signed)
Tips for success with Semaglutide (and by success, how not to be super sick on your stomach): Eat small meals AVOID heavy foods (fried/ high in carbs like bread, pasta, rice) AVOID carbonated beverages (soda/ beer, as these can increase bloating) DOUBLE your water intake (will help you avoid constipation/ dehydration)  Semaglutide CAN cause: Nausea Abdominal pain Increased acid reflux (sometimes presents as "sour burps") Constipation OR Diarrhea Fatigue (especially when you first start it)

## 2022-06-08 NOTE — Progress Notes (Signed)
Subjective: CC: Chronic follow-up PCP: Raliegh Ip, DO GYJ:Erika Ray is a 61 y.o. female presenting to clinic today for:  1.  Hyperlipidemia associated with obesity/hair thinning secondary to COVID-19 Patient is very interested in pursuing the compounded somatically tied with B12.  Her insurance unfortunately will not cover Wegovy or Saxenda.  She suffers from hyperlipidemia and reports that she has had a couple of relatives passed away suddenly with no apparent explanation of the last couple of months.  Her father suffered a myocardial infarction in his 65s.  No personal or family history of pancreatitis, medullary thyroid cancer or multiple endocrine type II neoplasia.  She had COVID-19 a couple of weeks ago and is still recovering from that.  Her hair continues within and her energy has not quite gotten back to normal   ROS: Per HPI  Allergies  Allergen Reactions   Ampicillin Rash   Penicillins Hives    All over the body, except the face.   Past Medical History:  Diagnosis Date   Broken arm 08/1966   left   Rectal bleeding    Rectal pain    Thrombosed hemorrhoids     Current Outpatient Medications:    Calcium-Magnesium-Vitamin D (CALCIUM 1200+D3 PO), Take by mouth., Disp: , Rfl:    rosuvastatin (CRESTOR) 5 MG tablet, Take 1 tablet (5 mg total) by mouth daily., Disp: 90 tablet, Rfl: 3   triamcinolone cream (KENALOG) 0.1 %, Apply 1 application topically 2 (two) times daily. For up to 7 days per flare, Disp: 30 g, Rfl: 0   valACYclovir (VALTREX) 500 MG tablet, TAKE 1 TABLET 3 TIMES A DAY FOR 7 DAYS, Disp: 21 tablet, Rfl: 11 Social History   Socioeconomic History   Marital status: Married    Spouse name: Not on file   Number of children: 0   Years of education: Not on file   Highest education level: Not on file  Occupational History   Not on file  Tobacco Use   Smoking status: Never   Smokeless tobacco: Never  Vaping Use   Vaping Use: Never used   Substance and Sexual Activity   Alcohol use: No   Drug use: No   Sexual activity: Not on file  Other Topics Concern   Not on file  Social History Narrative   Married, no children.  Has a mini [place] sheppard   Works for [employer] investment banker Strain: Not on [employer] Insecurity: Not on file  Transportation Needs: Not on file  Physical Activity: Not on file  Stress: Not on file  Social Connections: Not on file  Intimate Partner Violence: Not on file   Family History  Problem Relation Age of Onset   Cancer Mother 13       ovarian   Heart disease Father 23   COPD Father    Cancer Maternal Grandmother 80       breast   Thyroid disease Maternal Grandmother    Thyroid disease Maternal Aunt    Thyroid disease Maternal Uncle     Objective: Office vital signs reviewed. BP 121/78   Pulse 68   Temp (!) 97.1 F (36.2 C)   Ht 5\' 2"  (1.575 m)   Wt 167 lb 12.8 oz (76.1 kg)   LMP 10/22/2014   SpO2 96%   BMI 30.69 kg/m   Physical Examination:  General: Awake, alert, well appearing obese female, No acute distress HEENT: Sclera  white.  Moist mucous membranes.  TMs intact bilaterally.  Scant clear nasal discharge appreciated.  Oropharynx without erythema Cardio: regular rate and rhythm, S1S2 heard, no murmurs appreciated Pulm: clear to auscultation bilaterally, no wheezes, rhonchi or rales; normal work of breathing on room air Extremities: warm, well perfused, No edema, cyanosis or clubbing; +2 pulses bilaterally  Assessment/ Plan: 61 y.o. female   Obesity (BMI 30.0-34.9) - Plan: Semaglutide, 1 MG/DOSE, 4 MG/3ML SOPN  Pure hypercholesterolemia - Plan: Semaglutide, 1 MG/DOSE, 4 MG/3ML SOPN  Personal history of COVID-19  Hair thinning  [facility] faxed somatically tied compounded with B12 prescription as well as Zofran 4mg  every 8 as needed #10.  Plan for repeat fasting lipid at next visit.  Recovering from COVID-19.  No  apparent ongoing infection or secondary bacterial infections appreciated.  Encouraged her to hold off on influenza vaccination today given use of antivirals recently for COVID-19  I offered coronary artery calcium scoring today but she would like to hold off on that  No orders of the defined types were placed in this encounter.  No orders of the defined types were placed in this encounter.    Erika Norlander, DO [place] [phone]

## 2022-06-30 ENCOUNTER — Other Ambulatory Visit: Payer: Self-pay | Admitting: Family Medicine

## 2022-06-30 DIAGNOSIS — E7800 Pure hypercholesterolemia, unspecified: Secondary | ICD-10-CM

## 2022-07-06 NOTE — Telephone Encounter (Signed)
Pt aware we have ordered

## 2022-07-06 NOTE — Telephone Encounter (Signed)
Patient aware that prolia has been ordered and we will call once we receive.

## 2022-07-06 NOTE — Telephone Encounter (Signed)
Pt called to let nurse know that it is time for her Prolia. Can we order it and call pt to schedule appt when Prolia shot arrives?

## 2022-07-07 NOTE — Telephone Encounter (Signed)
Patient aware we have received prolia and appointment scheduled.

## 2022-07-10 ENCOUNTER — Ambulatory Visit (INDEPENDENT_AMBULATORY_CARE_PROVIDER_SITE_OTHER): Payer: BC Managed Care – PPO | Admitting: *Deleted

## 2022-07-10 DIAGNOSIS — Z23 Encounter for immunization: Secondary | ICD-10-CM

## 2022-07-10 DIAGNOSIS — M810 Age-related osteoporosis without current pathological fracture: Secondary | ICD-10-CM | POA: Diagnosis not present

## 2022-07-10 MED ORDER — DENOSUMAB 60 MG/ML ~~LOC~~ SOSY
60.0000 mg | PREFILLED_SYRINGE | Freq: Once | SUBCUTANEOUS | Status: AC
  Administered 2022-07-10: 60 mg via SUBCUTANEOUS

## 2022-07-10 NOTE — Progress Notes (Signed)
Pt given Prolia injection Left upper arm SubQ and pt tolerated well.  Buy and [employer]

## 2022-07-21 ENCOUNTER — Telehealth: Payer: BC Managed Care – PPO | Admitting: Nurse Practitioner

## 2022-07-21 ENCOUNTER — Encounter: Payer: Self-pay | Admitting: Nurse Practitioner

## 2022-07-21 DIAGNOSIS — J40 Bronchitis, not specified as acute or chronic: Secondary | ICD-10-CM | POA: Diagnosis not present

## 2022-07-21 MED ORDER — PREDNISONE 20 MG PO TABS: 40.0000 mg | ORAL_TABLET | Freq: Every day | ORAL | 0 refills | Status: AC

## 2022-07-21 MED ORDER — PROMETHAZINE-DM 6.25-15 MG/5ML PO SYRP: 5.0000 mL | ORAL_SOLUTION | Freq: Four times a day (QID) | ORAL | 0 refills | Status: DC | PRN

## 2022-07-21 NOTE — Patient Instructions (Signed)
1. Take meds as prescribed 2. Use a cool mist humidifier especially during the winter months and when heat has been humid. 3. Use saline nose sprays frequently 4. Saline irrigations of the nose can be very helpful if done frequently.  * 4X daily for 1 week*  * Use of a nettie pot can be helpful with this. Follow directions with this* 5. Drink plenty of fluids 6. Keep thermostat turn down low 7.For any cough or congestion- promethazine dm 8. For fever or aces or pains- take tylenol or ibuprofen appropriate for age and weight.  * for fevers greater than 101 orally you may alternate ibuprofen and tylenol every  3 hours.    

## 2022-07-21 NOTE — Progress Notes (Signed)
Virtual Visit Consent   Erika Ray, you are scheduled for a virtual visit with Erika Daphine Deutscher, FNP, a [facility] provider, today.     Just as with appointments in the office, your consent must be obtained to participate.  Your consent will be active for this visit and any virtual visit you may have with one of our providers in the next 365 days.     If you have a MyChart account, a copy of this consent can be sent to you electronically.  All virtual visits are billed to your insurance company just like a traditional visit in the office.    As this is a virtual visit, video technology does not allow for your provider to perform a traditional examination.  This may limit your provider's ability to fully assess your condition.  If your provider identifies any concerns that need to be evaluated in person or the need to arrange testing (such as labs, EKG, etc.), we will make arrangements to do so.     Although advances in technology are sophisticated, we cannot ensure that it will always work on either your end or our end.  If the connection with a video visit is poor, the visit may have to be switched to a telephone visit.  With either a video or telephone visit, we are not always able to ensure that we have a secure connection.     I need to obtain your verbal consent now.   Are you willing to proceed with your visit today? YES   Erika Ray has provided verbal consent on 07/21/2022 for a virtual visit (video or telephone).   Erika Daphine Deutscher, FNP   Date: 07/21/2022 3:54 PM   Virtual Visit via Video Note   I, Erika Daphine Deutscher, connected with Erika Ray ([id number], 1960-10-16) on 07/21/22 at  4:45 PM EDT by a video-enabled telemedicine application and verified that I am speaking with the correct person using two identifiers.  Location: Patient: Virtual Visit Location Patient: Home Provider: Virtual Visit Location Provider: Mobile   I discussed the limitations  of evaluation and management by telemedicine and the availability of in person appointments. The patient expressed understanding and agreed to proceed.    History of Present Illness: Erika Ray is a 61 y.o. who identifies as a female who was assigned female at birth, and is being seen today for cough7.  HPI: URI  This is a new problem. The current episode started in the past 7 days. The problem has been waxing and waning. There has been no fever. Associated symptoms include congestion, coughing, rhinorrhea and sneezing. Pertinent negatives include no headaches. She has tried acetaminophen for the symptoms. The treatment provided mild relief.    Review of Systems  HENT:  Positive for congestion, rhinorrhea and sneezing.   Respiratory:  Positive for cough.   Neurological:  Negative for headaches.    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   Medications:  Current Outpatient Medications:    Calcium-Magnesium-Vitamin  D (CALCIUM 1200+D3 PO), Take by mouth., Disp: , Rfl:    rosuvastatin (CRESTOR) 5 MG tablet, TAKE ONE TABLET ONCE DAILY, Disp: 90 tablet, Rfl: 1   Semaglutide, 1 MG/DOSE, 4 MG/3ML SOPN, Semaglutide compounded with B12 sublingually as directed sent to [place], Disp: 3 mL, Rfl: 0   triamcinolone cream (KENALOG) 0.1 %, Apply 1 application topically 2 (two) times daily. For up to 7 days per flare (Patient not taking: Reported on 06/08/2022), Disp: 30 g, Rfl: 0   valACYclovir  (VALTREX) 500 MG tablet, TAKE 1 TABLET 3 TIMES A DAY FOR 7 DAYS, Disp: 21 tablet, Rfl: 11  Observations/Objective: Patient is well-developed, well-nourished in no acute distress.  Resting comfortably  at home.  Head is normocephalic, atraumatic.  No labored breathing.  Speech is clear and coherent with logical content.  Patient is alert and oriented at baseline.  No cough noted during visit.  Assessment and Plan:  Erika Ray in today with chief complaint of URI   1. Bronchitis 1. Take meds as prescribed 2. Use a cool mist humidifier especially during the winter months and when heat has been humid. 3. Use saline nose sprays frequently 4. Saline irrigations of the nose can be very helpful if Ray frequently.  * 4X daily for 1 week*  * Use of a nettie pot can be helpful with this. Follow directions with this* 5. Drink plenty of fluids 6. Keep thermostat turn down low 7.For any cough or congestion- promethazine Dm 8. For fever or aces or pains- take tylenol or ibuprofen appropriate for age and weight.  * for fevers greater than 101 orally you may alternate ibuprofen and tylenol every  3 hours.   Meds ordered this encounter  Medications   predniSONE (DELTASONE) 20 MG tablet    Sig: Take 2 tablets (40 mg total) by mouth daily with breakfast for 5 days. 2 po daily for 5 days    Dispense:  10 tablet    Refill:  0    Order Specific Question:   Supervising Provider    Answer:   Caryl Pina A [[id number]]   promethazine-dextromethorphan (PROMETHAZINE-DM) 6.25-15 MG/5ML syrup    Sig: Take 5 mLs by mouth 4 (four) times daily as needed for cough.    Dispense:  118 mL    Refill:  0    Order Specific Question:   Supervising Provider    Answer:   Caryl Pina A [[id number]]       Follow Up Instructions: I discussed the assessment and treatment plan with the patient. The patient was provided an opportunity to ask questions and all were answered. The patient agreed with the  plan and demonstrated an understanding of the instructions.  A copy of instructions were sent to the patient via MyChart.  The patient was advised to call back or seek an in-person evaluation if the symptoms worsen or if the condition fails to improve as anticipated.  Time:  I spent 7 minutes with the patient via telehealth technology discussing the above problems/concerns.    Erika Hassell Done, FNP

## 2022-08-26 ENCOUNTER — Encounter: Payer: Self-pay | Admitting: Family Medicine

## 2022-08-26 ENCOUNTER — Ambulatory Visit: Payer: BC Managed Care – PPO | Admitting: Family Medicine

## 2022-08-26 VITALS — BP 135/72 | HR 72 | Temp 97.1°F | Ht 62.0 in | Wt 147.0 lb

## 2022-08-26 DIAGNOSIS — E663 Overweight: Secondary | ICD-10-CM

## 2022-08-26 DIAGNOSIS — Z8249 Family history of ischemic heart disease and other diseases of the circulatory system: Secondary | ICD-10-CM

## 2022-08-26 DIAGNOSIS — E7800 Pure hypercholesterolemia, unspecified: Secondary | ICD-10-CM | POA: Diagnosis not present

## 2022-08-26 DIAGNOSIS — L2089 Other atopic dermatitis: Secondary | ICD-10-CM

## 2022-08-26 DIAGNOSIS — Z713 Dietary counseling and surveillance: Secondary | ICD-10-CM

## 2022-08-26 MED ORDER — TRIAMCINOLONE ACETONIDE 0.1 % EX CREA: 1.0000 | TOPICAL_CREAM | Freq: Two times a day (BID) | CUTANEOUS | 0 refills | Status: DC

## 2022-08-26 NOTE — Progress Notes (Signed)
Subjective: CC: f/u weight PCP: Janora Norlander, DO JTT:SVXBL Erika Ray is Erika 61 y.o. female presenting to clinic today for:  1. Obesity associated with HLD Patient is now on 0.56m daily of semaglutide sublingually with B12.  She is doing extremely well.  She is down 20 pounds since our visit in September.  Denies any excessive nausea, vomiting, abdominal pain or other side effects.  She has dropped 2 pant sizes and 2 inches in her breast.  Her upper back pain is improving.  She is interested in proceeding with CT coronary artery scan given the family history of MI and known hyperlipidemia.  No chest pain, shortness of breath.   ROS: Per HPI  Allergies  Allergen Reactions   Ampicillin Rash   Penicillins Hives    All over the body, except the face.   Past Medical History:  Diagnosis Date   Broken arm 08/1966   left   Rectal bleeding    Rectal pain    Thrombosed hemorrhoids     Current Outpatient Medications:    Calcium-Magnesium-Vitamin D (CALCIUM 1200+D3 PO), Take by mouth., Disp: , Rfl:    rosuvastatin (CRESTOR) 5 MG tablet, TAKE ONE TABLET ONCE DAILY, Disp: 90 tablet, Rfl: 1   Semaglutide, 1 MG/DOSE, 4 MG/3ML SOPN, Semaglutide compounded with B12 sublingually as directed sent to [facility] Disp: 3 mL, Rfl: 0   triamcinolone cream (KENALOG) 0.1 %, Apply 1 application topically 2 (two) times daily. For up to 7 days per flare, Disp: 30 g, Rfl: 0   valACYclovir (VALTREX) 500 MG tablet, TAKE 1 TABLET 3 TIMES Erika DAY FOR 7 DAYS, Disp: 21 tablet, Rfl: 11   promethazine-dextromethorphan (PROMETHAZINE-DM) 6.25-15 MG/5ML syrup, Take 5 mLs by mouth 4 (four) times daily as needed for cough. (Patient not taking: Reported on 08/26/2022), Disp: 118 mL, Rfl: 0 Social History   Socioeconomic History   Marital status: Married    Spouse name: Not on file   Number of children: 0   Years of education: Not on file   Highest education level: Not on file  Occupational History   Not on  file  Tobacco Use   Smoking status: Never   Smokeless tobacco: Never  Vaping Use   Vaping Use: Never used  Substance and Sexual Activity   Alcohol use: No   Drug use: No   Sexual activity: Not on file  Other Topics Concern   Not on file  Social History Narrative   Married, no children.  Has Erika mini aCubasheppard   Works for PCoveloStrain: Not on fComcastInsecurity: Not on file  Transportation Needs: Not on file  Physical Activity: Not on file  Stress: Not on file  Social Connections: Not on file  Intimate Partner Violence: Not on file   Family History  Problem Relation Age of Onset   Cancer Mother 537      ovarian   Heart disease Father 823  COPD Father    Cancer Maternal Grandmother 88       breast   Thyroid disease Maternal Grandmother    Thyroid disease Maternal Aunt    Thyroid disease Maternal Uncle     Objective: Office vital signs reviewed. BP 135/72   Pulse 72   Temp (!) 97.1 F (36.2 C)   Ht _0  (1.575 m)   Wt 147 lb (66.7 kg)   LMP 10/22/2014  SpO2 98%   BMI 26.89 kg/m   Physical Examination:  General: Awake, alert, well nourished, No acute distress HEENT: sclera white, MMM Cardio: regular rate and rhythm, S1S2 heard, no murmurs appreciated Pulm: clear to auscultation bilaterally, no wheezes, rhonchi or rales; normal work of breathing on room air    Assessment/ Plan: 61 y.o. female   Weight loss counseling, encounter for  Overweight (BMI 25.0-29.9) - Plan: CT CARDIAC SCORING (SELF PAY ONLY)  Pure hypercholesterolemia - Plan: CT CARDIAC SCORING (SELF PAY ONLY), CMP14+EGFR, Lipid Panel  Family history of MI (myocardial infarction) - Plan: CT CARDIAC SCORING (SELF PAY ONLY)  Other atopic dermatitis - Plan: triamcinolone cream (KENALOG) 0.1 %  Doing excellent.  BMI now into the overweight category.  Continue semaglutide compound.  Does not need refills  Fasting lipid,  CMP collected  CT coronary artery calcium score ordered.  She may follow-up in 6 months as scheduled for annual physical exam  No orders of the defined types were placed in this encounter.  No orders of the defined types were placed in this encounter.    Janora Norlander, DO [place] [phone]

## 2022-08-27 LAB — LIPID PANEL
Chol/HDL Ratio: 2.4 ratio (ref 0.0–4.4)
Cholesterol, Total: 171 mg/dL (ref 100–199)
HDL: 71 mg/dL (ref >39)
LDL Chol Calc (NIH): 89 mg/dL (ref 0–99)
Triglycerides: 54 mg/dL (ref 0–149)
VLDL Cholesterol Cal: 11 mg/dL (ref 5–40)

## 2022-08-27 LAB — CMP14+EGFR
ALT: 16 IU/L (ref 0–32)
AST: 22 IU/L (ref 0–40)
Albumin/Globulin Ratio: 3.2 — ABNORMAL HIGH (ref 1.2–2.2)
Albumin: 4.8 g/dL (ref 3.8–4.9)
Alkaline Phosphatase: 48 IU/L (ref 44–121)
BUN/Creatinine Ratio: 16 (ref 12–28)
BUN: 8 mg/dL (ref 8–27)
Bilirubin Total: 0.4 mg/dL (ref 0.0–1.2)
CO2: 25 mmol/L (ref 20–29)
Calcium: 9.3 mg/dL (ref 8.7–10.3)
Chloride: 103 mmol/L (ref 96–106)
Creatinine, Ser: 0.51 mg/dL — ABNORMAL LOW (ref 0.57–1.00)
Globulin, Total: 1.5 g/dL (ref 1.5–4.5)
Glucose: 77 mg/dL (ref 70–99)
Potassium: 3.8 mmol/L (ref 3.5–5.2)
Sodium: 142 mmol/L (ref 134–144)
Total Protein: 6.3 g/dL (ref 6.0–8.5)
eGFR: 107 mL/min/{1.73_m2} (ref >59)

## 2022-09-08 ENCOUNTER — Ambulatory Visit (HOSPITAL_COMMUNITY)
Admission: RE | Admit: 2022-09-08 | Discharge: 2022-09-08 | Disposition: A | Discharge status: Home / Self Care | Payer: BC Managed Care – PPO | Source: Ambulatory Visit | Attending: Family Medicine | Admitting: Family Medicine

## 2022-09-08 DIAGNOSIS — E7800 Pure hypercholesterolemia, unspecified: Secondary | ICD-10-CM | POA: Insufficient documentation

## 2022-09-08 DIAGNOSIS — E663 Overweight: Secondary | ICD-10-CM | POA: Insufficient documentation

## 2022-09-08 DIAGNOSIS — Z8249 Family history of ischemic heart disease and other diseases of the circulatory system: Secondary | ICD-10-CM | POA: Insufficient documentation

## 2022-10-07 IMAGING — MG MM DIGITAL SCREENING BILAT W/ TOMO AND CAD
6 of 12 series · 6 of 36 positions shown · non-contrast
Comparison: Previous exam(s).

CLINICAL DATA: Screening.

EXAM:
DIGITAL SCREENING BILATERAL MAMMOGRAM WITH TOMOSYNTHESIS AND CAD
TECHNIQUE: Bilateral screening digital craniocaudal and mediolateral oblique
mammograms were obtained. Bilateral screening digital breast
tomosynthesis was performed. The images were evaluated with
computer-aided detection.

[L MLO synth-2D]
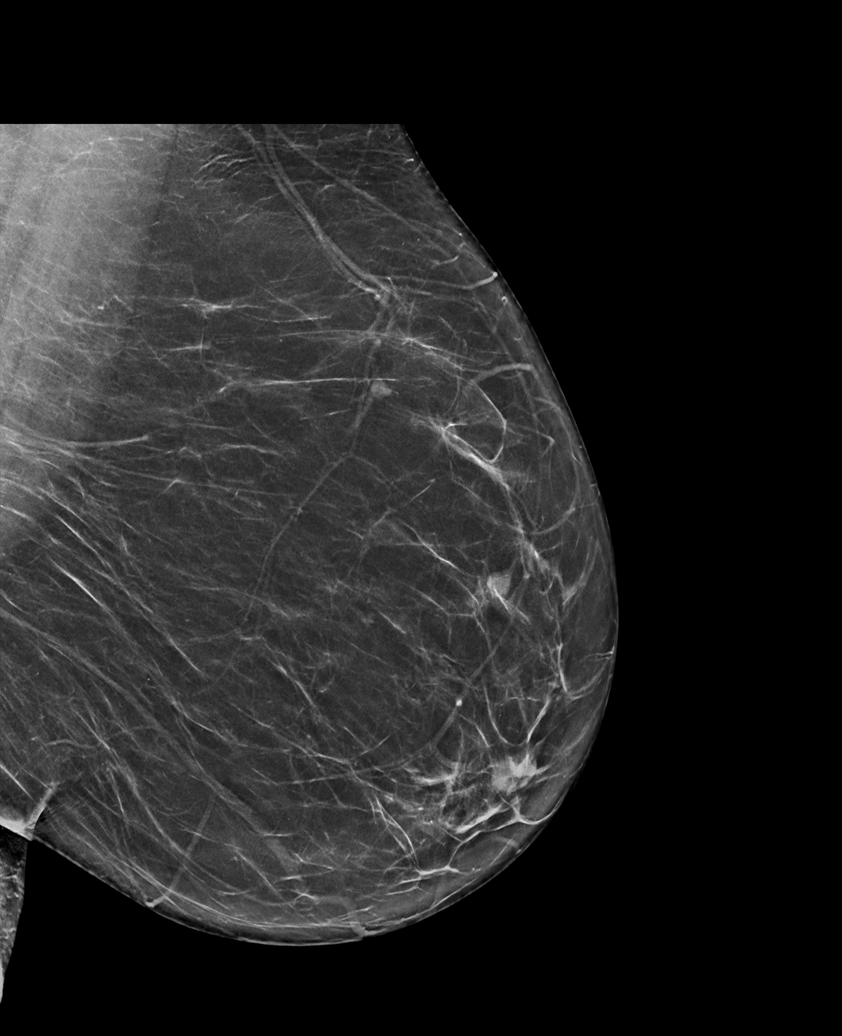

[L CC synth-2D (1 of 2)]
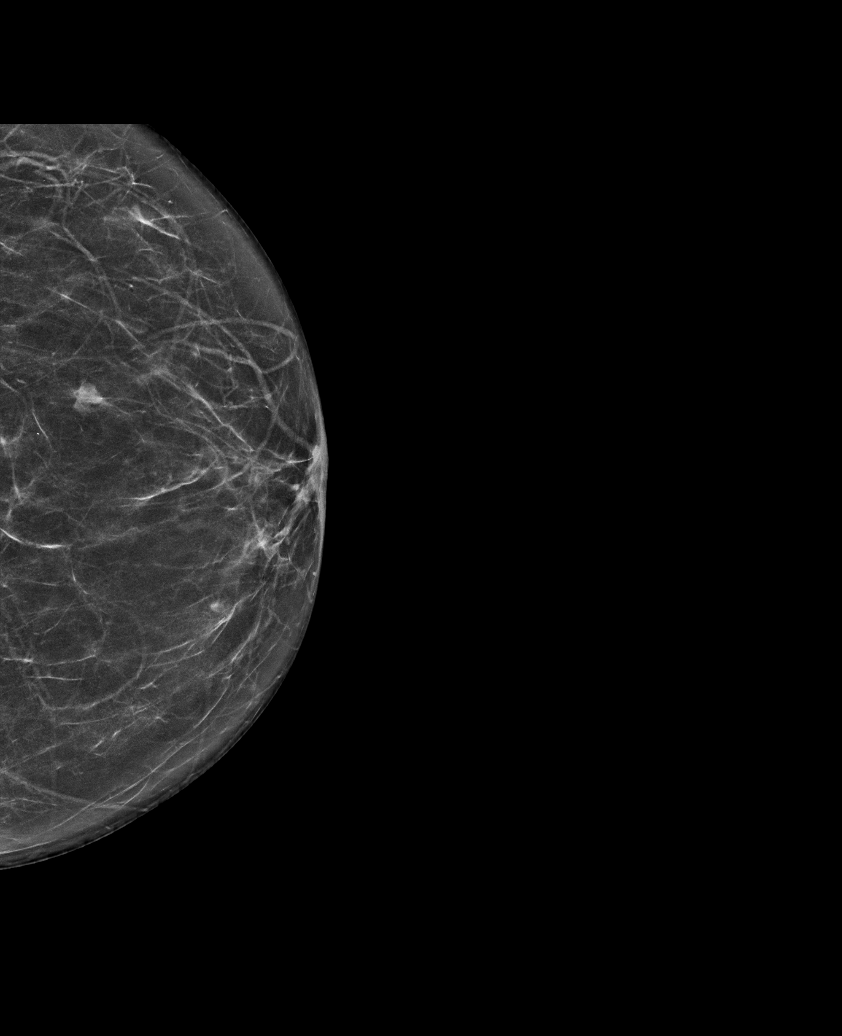

[L CC synth-2D (2 of 2)]
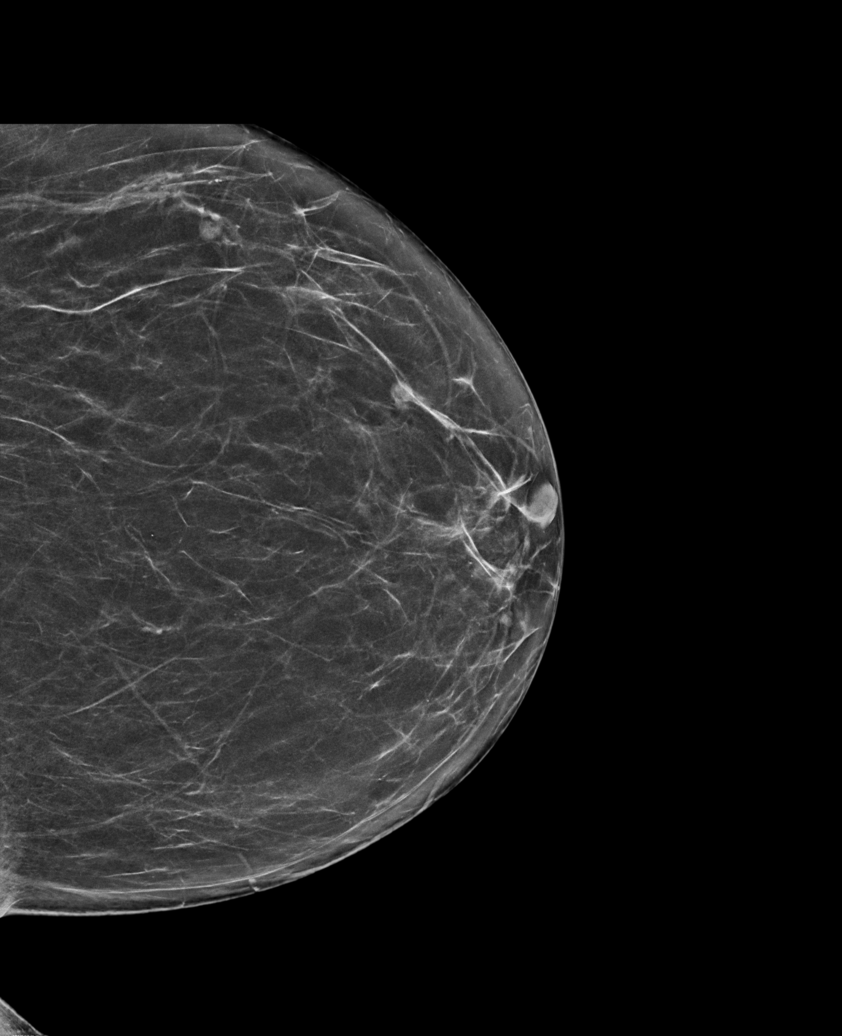

[R MLO synth-2D]
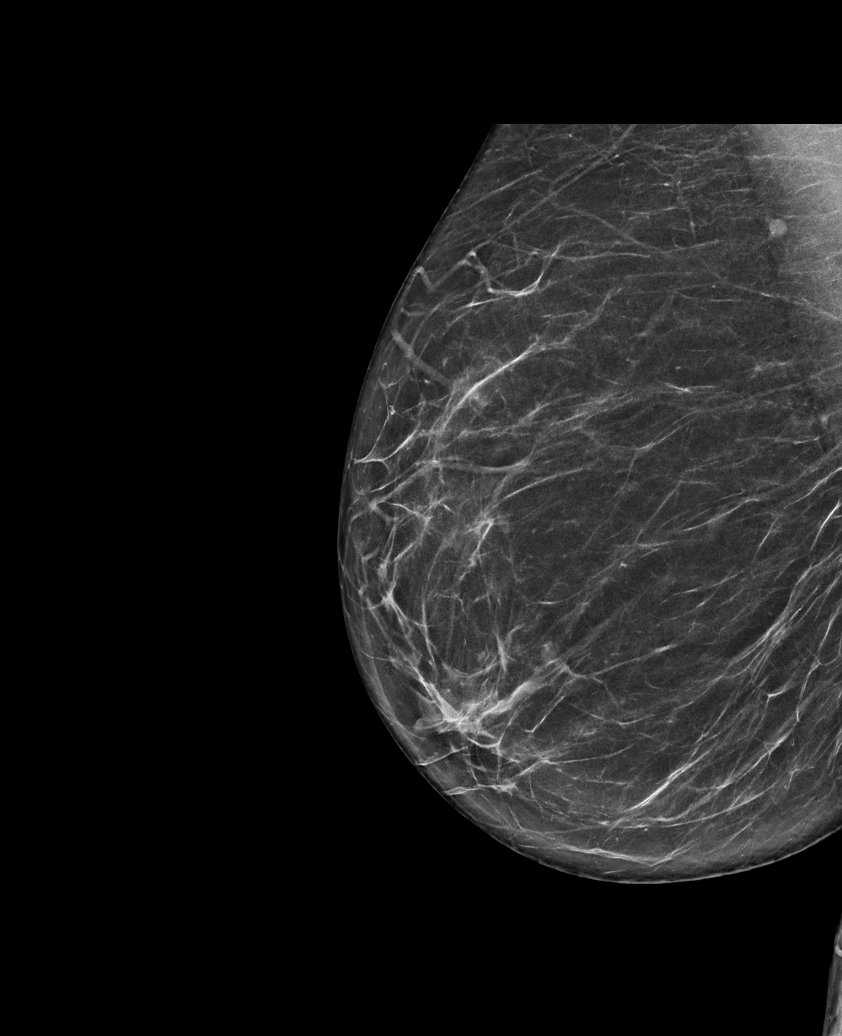

[R CC synth-2D (1 of 2)]
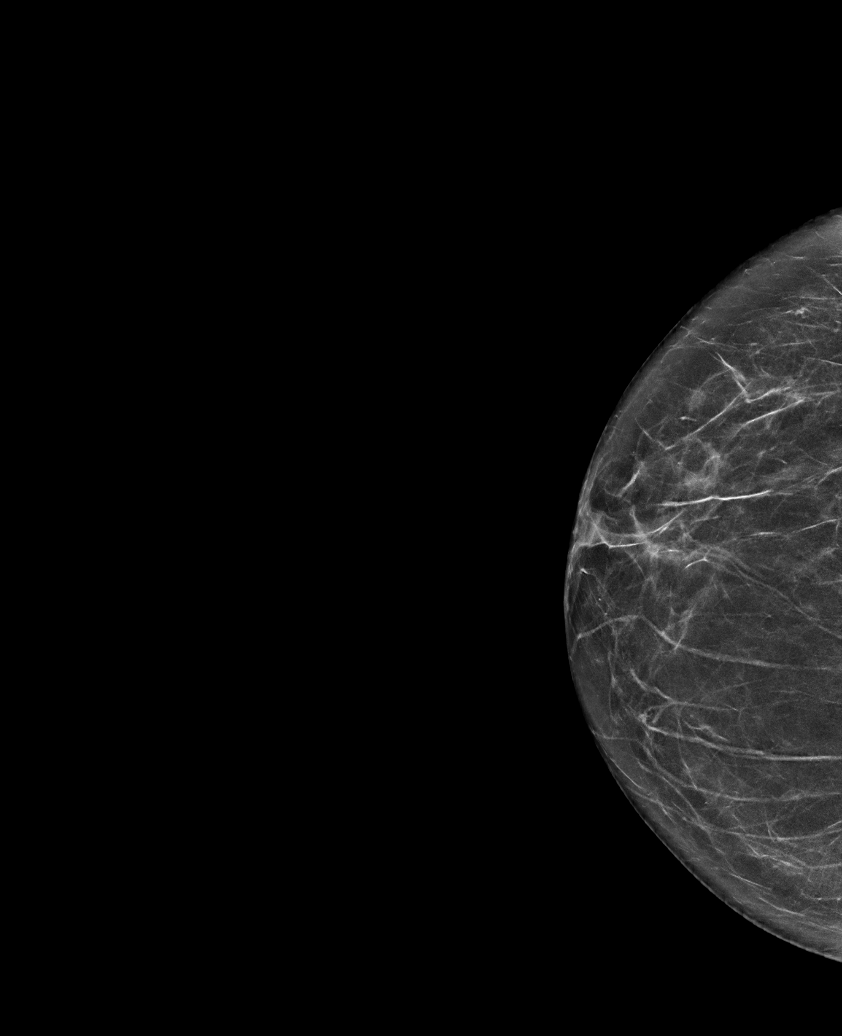

[R CC synth-2D (2 of 2)]
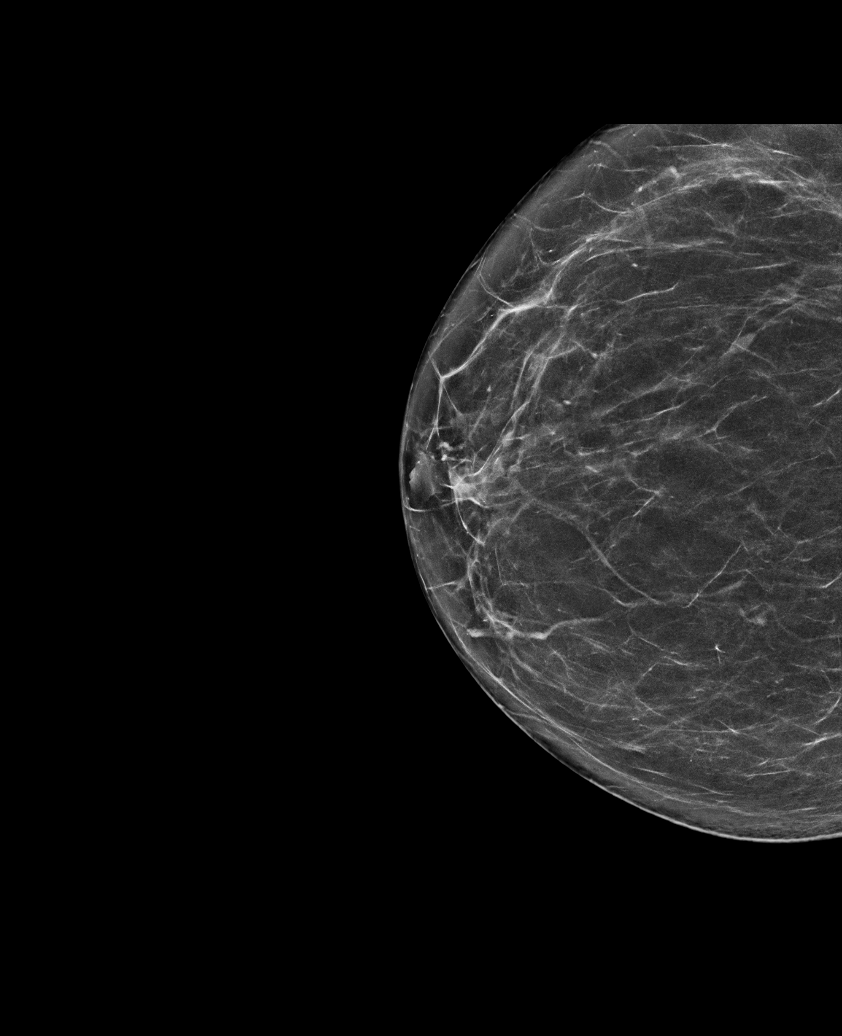

[6 of 36 positions shown; findings below may reference images not displayed]

ACR Breast Density Category b: There are scattered areas of
fibroglandular density.
FINDINGS: In the right breast, a possible asymmetry warrants further
evaluation. In the left breast, no findings suspicious for
malignancy.
IMPRESSION: Further evaluation is suggested for possible asymmetry in the right
breast.

RECOMMENDATION:
Diagnostic mammogram and possibly ultrasound of the right breast.
(Code:[id number])

The patient will be contacted regarding the findings, and additional
imaging will be scheduled.

BI-RADS CATEGORY  0: Incomplete. Need additional imaging evaluation
and/or prior mammograms for comparison.

## 2022-10-15 DIAGNOSIS — B070 Plantar wart: Secondary | ICD-10-CM | POA: Diagnosis not present

## 2022-10-15 DIAGNOSIS — M79671 Pain in right foot: Secondary | ICD-10-CM | POA: Diagnosis not present

## 2022-10-29 ENCOUNTER — Other Ambulatory Visit: Payer: Self-pay | Admitting: Obstetrics and Gynecology

## 2022-10-29 ENCOUNTER — Other Ambulatory Visit: Payer: Self-pay | Admitting: Obstetrics

## 2022-10-29 DIAGNOSIS — Z1231 Encounter for screening mammogram for malignant neoplasm of breast: Secondary | ICD-10-CM

## 2022-11-10 DIAGNOSIS — B070 Plantar wart: Secondary | ICD-10-CM | POA: Diagnosis not present

## 2022-12-22 ENCOUNTER — Other Ambulatory Visit: Payer: Self-pay | Admitting: Family Medicine

## 2022-12-22 DIAGNOSIS — E7800 Pure hypercholesterolemia, unspecified: Secondary | ICD-10-CM

## 2022-12-28 ENCOUNTER — Telehealth: Payer: Self-pay | Admitting: Family Medicine

## 2022-12-31 ENCOUNTER — Ambulatory Visit
Admission: RE | Admit: 2022-12-31 | Discharge: 2022-12-31 | Disposition: A | Discharge status: Home / Self Care | Payer: BC Managed Care – PPO | Source: Ambulatory Visit | Attending: Obstetrics and Gynecology | Admitting: Obstetrics and Gynecology

## 2022-12-31 DIAGNOSIS — Z1231 Encounter for screening mammogram for malignant neoplasm of breast: Secondary | ICD-10-CM

## 2023-01-04 ENCOUNTER — Telehealth: Payer: Self-pay

## 2023-01-04 NOTE — Telephone Encounter (Signed)
Prolia VOB initiated via [email or web address]  Last Prolia inj: 07/10/22 Next Prolia inj DUE: 01/10/23

## 2023-01-05 NOTE — Telephone Encounter (Signed)
Pharmacy Patient Advocate Encounter   Received notification that prior authorization for Prolia is required/requested.   PA submitted on 01/05/23 to (ins) [employer] via [employer] portal Key  # [id number] Status is pending

## 2023-01-06 NOTE — Telephone Encounter (Signed)
Placed a call to the insurance at [phone] to check the status of the prior authorization. The representative stated a decision will be make on 01/08/23.  Key# [id number]

## 2023-01-25 NOTE — Telephone Encounter (Signed)
Pt called to check status update on PA for Prolia. Please advise.

## 2023-01-26 ENCOUNTER — Other Ambulatory Visit (HOSPITAL_COMMUNITY): Payer: Self-pay

## 2023-01-26 NOTE — Telephone Encounter (Signed)
Pt ready for scheduling for Prolia on or after : 01/26/23  Out-of-pocket cost due at time of visit: $0  Primary: [employer] commercial Prolia co-insurance: 0% Admin fee co-insurance: 0%  Secondary: --- Prolia co-insurance:  Admin fee co-insurance:   Medical Benefit Details: Date Benefits were checked: 01/04/23 Deductible: NO/ Coinsurance: 0%/ Admin Fee: 0%  Prior Auth: APPROVED PA# [id number]  Expiration Date: 01/05/2024   Pharmacy benefit: Copay $--- If patient wants fill through the pharmacy benefit please send prescription to:  --- , and include estimated need by date in rx notes. Pharmacy will ship medication directly to the office.  Patient is eligible for Prolia Copay Card. Copay Card can make patient's cost as little as $25. Link to apply: [email or web address]://[email or web address]  ** This summary of benefits is an estimation of the patient's out-of-pocket cost. Exact cost may very based on individual plan coverage.

## 2023-01-26 NOTE — Telephone Encounter (Signed)
Called insurance at [phone] to check the status of the prior authorization. PA has been approved. Effective 01/05/23-01/05/24

## 2023-01-27 ENCOUNTER — Encounter: Payer: Self-pay | Admitting: Family Medicine

## 2023-01-27 NOTE — Telephone Encounter (Signed)
Looks like patient hasn't been successful in reaching anyone.  Can you please help her get her shot?

## 2023-02-01 NOTE — Telephone Encounter (Signed)
Prolia ordered for patient through buy and bill.

## 2023-02-03 ENCOUNTER — Ambulatory Visit (INDEPENDENT_AMBULATORY_CARE_PROVIDER_SITE_OTHER): Payer: BC Managed Care – PPO

## 2023-02-03 DIAGNOSIS — M810 Age-related osteoporosis without current pathological fracture: Secondary | ICD-10-CM

## 2023-02-03 MED ORDER — DENOSUMAB 60 MG/ML ~~LOC~~ SOSY: 60.0000 mg | PREFILLED_SYRINGE | Freq: Once | SUBCUTANEOUS | 0 refills | Status: DC

## 2023-02-03 MED ORDER — DENOSUMAB 60 MG/ML ~~LOC~~ SOSY
60.0000 mg | PREFILLED_SYRINGE | Freq: Once | SUBCUTANEOUS | Status: AC
Start: 2023-02-03 — End: 2023-02-03
  Administered 2023-02-03: 60 mg via SUBCUTANEOUS

## 2023-02-03 NOTE — Progress Notes (Signed)
Prolia injection given to right arm. Patient tolerated well. Buy and Bill  

## 2023-02-11 DIAGNOSIS — Z01419 Encounter for gynecological examination (general) (routine) without abnormal findings: Secondary | ICD-10-CM | POA: Diagnosis not present

## 2023-02-11 DIAGNOSIS — Z6824 Body mass index (BMI) 24.0-24.9, adult: Secondary | ICD-10-CM | POA: Diagnosis not present

## 2023-02-12 NOTE — Telephone Encounter (Signed)
Prolia given 02/03/23

## 2023-02-15 ENCOUNTER — Other Ambulatory Visit (HOSPITAL_COMMUNITY): Payer: Self-pay

## 2023-04-12 ENCOUNTER — Ambulatory Visit (INDEPENDENT_AMBULATORY_CARE_PROVIDER_SITE_OTHER): Payer: BC Managed Care – PPO | Admitting: Family Medicine

## 2023-04-12 ENCOUNTER — Telehealth: Payer: Self-pay | Admitting: Family Medicine

## 2023-04-12 ENCOUNTER — Encounter: Payer: Self-pay | Admitting: Family Medicine

## 2023-04-12 VITALS — BP 114/69 | HR 61 | Temp 97.8°F | Ht 62.0 in | Wt 137.6 lb

## 2023-04-12 DIAGNOSIS — L2089 Other atopic dermatitis: Secondary | ICD-10-CM

## 2023-04-12 DIAGNOSIS — R911 Solitary pulmonary nodule: Secondary | ICD-10-CM | POA: Diagnosis not present

## 2023-04-12 DIAGNOSIS — Z0001 Encounter for general adult medical examination with abnormal findings: Secondary | ICD-10-CM

## 2023-04-12 DIAGNOSIS — E200 Idiopathic hypoparathyroidism: Secondary | ICD-10-CM | POA: Diagnosis not present

## 2023-04-12 DIAGNOSIS — M810 Age-related osteoporosis without current pathological fracture: Secondary | ICD-10-CM

## 2023-04-12 DIAGNOSIS — B029 Zoster without complications: Secondary | ICD-10-CM

## 2023-04-12 DIAGNOSIS — Z114 Encounter for screening for human immunodeficiency virus [HIV]: Secondary | ICD-10-CM | POA: Diagnosis not present

## 2023-04-12 DIAGNOSIS — E7800 Pure hypercholesterolemia, unspecified: Secondary | ICD-10-CM

## 2023-04-12 DIAGNOSIS — Z124 Encounter for screening for malignant neoplasm of cervix: Secondary | ICD-10-CM

## 2023-04-12 DIAGNOSIS — M62830 Muscle spasm of back: Secondary | ICD-10-CM

## 2023-04-12 DIAGNOSIS — Z8249 Family history of ischemic heart disease and other diseases of the circulatory system: Secondary | ICD-10-CM

## 2023-04-12 DIAGNOSIS — Z1159 Encounter for screening for other viral diseases: Secondary | ICD-10-CM

## 2023-04-12 DIAGNOSIS — Z0000 Encounter for general adult medical examination without abnormal findings: Secondary | ICD-10-CM

## 2023-04-12 MED ORDER — VALACYCLOVIR HCL 500 MG PO TABS
ORAL_TABLET | ORAL | 4 refills | Status: DC
Start: 2023-04-12 — End: 2024-04-17

## 2023-04-12 MED ORDER — TIZANIDINE HCL 4 MG PO TABS: 4.0000 mg | ORAL_TABLET | Freq: Four times a day (QID) | ORAL | 0 refills | Status: DC | PRN

## 2023-04-12 MED ORDER — TRIAMCINOLONE ACETONIDE 0.1 % EX CREA
1.0000 | TOPICAL_CREAM | Freq: Two times a day (BID) | CUTANEOUS | 0 refills | Status: DC
Start: 2023-04-12 — End: 2024-04-17

## 2023-04-12 MED ORDER — ROSUVASTATIN CALCIUM 5 MG PO TABS
5.0000 mg | ORAL_TABLET | Freq: Every day | ORAL | 3 refills | Status: DC
Start: 2023-04-12 — End: 2024-04-17

## 2023-04-12 NOTE — Progress Notes (Signed)
Erika Ray is a 62 y.o. female presents to office today for annual physical exam examination.    Concerns today include: 1.  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  She works at [employer] and she has been having to use her upper extremities quite a bit.  Sometimes she does get some numbness and tingling in her hands but this occurs primarily at bedtime.  She does not report any weakness.  She utilizes Tylenol with some improvement in back issues.  She has previously tried taking naproxen but it upset her stomach too much.  2.  Pulmonary nodule Noted to have a pulmonary nodule on coronary artery calcium scan last year.  She has never been a smoker but has had issues with recurrent bronchitis so she wanted to pursue repeat CT scan this year.  She asked that this be rechecked.  No reports of hemoptysis, unplanned weight loss or shortness of breath  Occupation: Secondary school teacher, Substance use: none Health Maintenance Due  Topic Date Due   HIV Screening  Never done   Hepatitis C Screening  Never done   Refills needed today: all Immunizations needed: Immunization History  Administered Date(s) Administered   Influenza,inj,Quad PF,6+ Mos 09/02/2015, 07/20/2017, 08/12/2018, 07/12/2020, 07/01/2021, 07/10/2022   Influenza,inj,quad, With Preservative 06/19/2019   Influenza-Unspecified 06/09/2019   PFIZER(Purple Top)SARS-COV-2 Vaccination 12/06/2019, 12/27/2019, 08/08/2020, 01/23/2021, 07/16/2021, 08/21/2022   Td 06/08/2022   Zoster Recombinant(Shingrix) 02/04/2017, 04/29/2017     Past Medical History:  Diagnosis Date   Broken arm 08/1966   left   Rectal bleeding    Rectal pain    Thrombosed hemorrhoids    Social History   Socioeconomic History   Marital status: Married    Spouse name: Not on file   Number of children: 0   Years of education: Not on file   Highest education level: Not on file  Occupational History   Not on file  Tobacco  Use   Smoking status: Never   Smokeless tobacco: Never  Vaping Use   Vaping status: Never Used  Substance and Sexual Activity   Alcohol use: No   Drug use: No   Sexual activity: Not on file  Other Topics Concern   Not on file  Social History Narrative   Married, no children.  Has a mini [place] sheppard   Works for [employer] Strain: Not on [employer] Insecurity: Not on file  Transportation Needs: Not on file  Physical Activity: Not on file  Stress: Not on file  Social Connections: Not on file  Intimate Partner Violence: Not on file   Past Surgical History:  Procedure Laterality Date   FRACTURE SURGERY  1967   left arm   HEMORROIDECTOMY  06/11/11   thromb hems   Family History  Problem Relation Age of Onset   Cancer Mother 58       ovarian   Heart disease Father 70   COPD Father    Cancer Maternal Grandmother 96       breast   Thyroid disease Maternal Grandmother    Thyroid disease Maternal Aunt    Thyroid disease Maternal Uncle     Current Outpatient Medications:    Calcium-Magnesium-Vitamin D (CALCIUM 1200+D3 PO), Take by mouth., Disp: , Rfl:    tiZANidine (ZANAFLEX) 4 MG tablet, Take 1 tablet (4 mg total) by mouth every 6 (six) hours as  needed for muscle spasms., Disp: 20 tablet, Rfl: 0   rosuvastatin (CRESTOR) 5 MG tablet, Take 1 tablet (5 mg total) by mouth daily., Disp: 90 tablet, Rfl: 3   triamcinolone cream (KENALOG) 0.1 %, Apply 1 Application topically 2 (two) times daily. For up to 7 days per flare, Disp: 30 g, Rfl: 0   valACYclovir (VALTREX) 500 MG tablet, TAKE 1 TABLET 3 TIMES A DAY FOR 7 DAYS, Disp: 21 tablet, Rfl: 4  Allergies  Allergen Reactions   Ampicillin Rash   Penicillins Hives    All over the body, except the face.     ROS: Review of Systems A comprehensive review of systems was negative except for: Genitourinary: positive for occasional stress incontinence Musculoskeletal:  positive for back pain    Physical exam BP 114/69   Pulse 61   Temp 97.8 F (36.6 C) (Temporal)   Ht 5\' 2"  (1.575 m)   Wt 137 lb 9.6 oz (62.4 kg)   LMP 10/22/2014   SpO2 99%   BMI 25.17 kg/m  General appearance: alert, cooperative, appears stated age, and no distress Head: Normocephalic, without obvious abnormality, atraumatic Eyes: negative findings: lids and lashes normal and conjunctivae and sclerae normal Ears: normal TM's and external ear canals both ears Nose: Nares normal. Septum midline. Mucosa normal. No drainage or sinus tenderness. Throat: lips, mucosa, and tongue normal; teeth and gums normal Neck: no adenopathy, no carotid bruit, supple, symmetrical, trachea midline, and thyroid not enlarged, symmetric, no tenderness/mass/nodules Back: symmetric, no curvature. ROM normal. No CVA tenderness. Lungs: clear to auscultation bilaterally Heart: regular rate and rhythm, S1, S2 normal, no murmur, click, rub or gallop Abdomen: soft, non-tender; bowel sounds normal; no masses,  no organomegaly Extremities: extremities normal, atraumatic, no cyanosis or edema Pulses: 2+ and symmetric Skin: Skin color, texture, turgor normal. No rashes or lesions Lymph nodes: Cervical, supraclavicular, and axillary nodes normal. Neurologic: Grossly normal       04/12/2023    3:37 PM 08/26/2022    2:46 PM 06/08/2022    2:50 PM  Depression screen PHQ 2/9  Decreased Interest 0 0 0  Down, Depressed, Hopeless 0 0 0  PHQ - 2 Score 0 0 0  Altered sleeping 0    Tired, decreased energy 0    Change in appetite 0    Feeling bad or failure about yourself  0    Trouble concentrating 0    Moving slowly or fidgety/restless 0    Suicidal thoughts 0    PHQ-9 Score 0    Difficult doing work/chores Not difficult at all        04/12/2023    3:37 PM 08/26/2022    2:46 PM 06/08/2022    2:50 PM 04/10/2022    3:42 PM  GAD 7 : Generalized Anxiety Score  Nervous, Anxious, on Edge 0 0 0 0  Control/stop  worrying 0 0 0 0  Worry too much - different things 0 0 0 0  Trouble relaxing 0 0 0 0  Restless 0 0 0 0  Easily annoyed or irritable 0 0 0 0  Afraid - awful might happen 0 0 0 0  Total GAD 7 Score 0 0 0 0  Anxiety Difficulty Not difficult at all Not difficult at all Not difficult at all Not difficult at all     Assessment/ Plan: Erika Ray here for annual physical exam.   Annual physical exam  Cervical cancer screening - Plan: CANCELED: Cytology - PAP  Screening  for HIV (human immunodeficiency virus) - Plan: HIV antibody (with reflex), HIV antibody (with reflex)  Encounter for hepatitis C screening test for low risk patient - Plan: Hepatitis C antibody, Hepatitis C antibody  Age-related osteoporosis without current pathological fracture - Plan: DG [facility] DEXA, VITAMIN D 25 Hydroxy (Vit-D Deficiency, Fractures), CMP14+EGFR, CBC, CBC, CMP14+EGFR, VITAMIN D 25 Hydroxy (Vit-D Deficiency, Fractures)  Pure hypercholesterolemia - Plan: CMP14+EGFR, rosuvastatin (CRESTOR) 5 MG tablet, CMP14+EGFR  Family history of MI (myocardial infarction) - Plan: CMP14+EGFR, CMP14+EGFR  Idiopathic hypoparathyroidism (HCC) - Plan: VITAMIN D 25 Hydroxy (Vit-D Deficiency, Fractures), CMP14+EGFR, CBC, CBC, CMP14+EGFR, VITAMIN D 25 Hydroxy (Vit-D Deficiency, Fractures)  Herpes zoster without complication - Plan: valACYclovir (VALTREX) 500 MG tablet  Other atopic dermatitis - Plan: triamcinolone cream (KENALOG) 0.1 %  Spasm of thoracic back muscle - Plan: tiZANidine (ZANAFLEX) 4 MG tablet  Lung nodule - Plan: CT Chest Wo Contrast  Fasting labs collected today.  ROI completed for Pap smear from her OB/GYN at [facility].  No vaginal or breast concerns today.  Will plan for DEXA sometime in the fall.  Check calcium level, renal function, vitamin D level.  Not currently treated with medication for osteoporosis  Lipid panel collected.  Continue statin.  Valtrex renewed, triamcinolone renewed and  tizanidine prescribed for thoracic back spasm.  Okay to continue Tylenol, heat and lidocaine patches if needed  CT chest without contrast to follow-up on nodule.  Patient is considered low risk given absence of smoking history.  If stable, no plans for further surveillance unless clinical concern arises  Counseled on healthy lifestyle choices, including diet (rich in fruits, vegetables and lean meats and low in salt and simple carbohydrates) and exercise (at least 30 minutes of moderate physical activity daily).  Patient to follow up 3m for DEXA  Erika Ray M. Nadine Counts, DO

## 2023-04-12 NOTE — Telephone Encounter (Signed)
She is low risk of that lung nodule being cancerous given lack of smoking history.  I believe she wanted elective repeat of CT.  I have placed order and she should be contacted to schedule sometime in December.

## 2023-04-12 NOTE — Telephone Encounter (Signed)
Pt forgot to ask Dr Nadine Counts if she was going to order for her to have a chest test done this year because last year something was found on her lungs and remembered Dr Nadine Counts telling her that it would be rechecked every year.

## 2023-04-12 NOTE — Patient Instructions (Addendum)
Dexa Due in OCTOBER.  Please make appointment with [facility] for this at checkout today.  Voltaren gel to wrists/ hands if needed for pain/ swelling. Wrist bracing at night for carpal tunnel  Thoracic Strain A thoracic strain is an injury to the muscles or tendons that attach to the upper back. Tendons are tissues that connect muscle to bone. This injury is sometimes called a mid-back strain. A strain can be mild or very bad. A mild strain may take only 1-2 weeks to heal. A very bad strain involves torn muscles or tendons, so it may take 6-8 weeks to heal. What are the causes? This condition may be caused by: A fall or a hit to the body. Twisting or stretching the back too far. This may happen when doing activities that take a lot of energy, such as lifting heavy objects. In some cases, the cause may not be known. What increases the risk? This injury is more common in: Athletes. People who are very overweight (obese). What are the signs or symptoms? Pain in the middle back, especially with movement. This is the main symptom. Stiffness or limited range of motion. Sudden muscle tightening (spasms). How is this treated? This condition may be treated with: Resting the injured area. Putting heat and cold on the injured area. Medicines for pain and inflammation, such as NSAIDs. Prescription medicine for pain or to relax the muscles. These may be used for a short time if needed. Physical therapy. This is doing exercises to help you move better and get stronger. Treatments done to the painful area. This may be: Electrical stimulation. Engaging the muscle with small needles (dry needling). Shots of medicine (trigger point injections). Follow these instructions at home: Managing pain, stiffness, and swelling     If told, put ice on the injured area. Put ice in a plastic bag. Place a towel between your skin and the bag. Leave the ice on for 20 minutes, 2-3 times a day. If told, put heat on  the affected area. Do this as often as told by your doctor. Use the heat source that your doctor recommends, such as a moist heat pack or a heating pad. Place a towel between your skin and the heat source. Leave the heat on for 20-30 minutes. If your skin turns bright red, take off the ice or heat right away to prevent skin damage. The risk of damage is higher if you cannot feel pain, heat, or cold. Activity Rest as told. Return to your normal activities when your doctor says that it is safe. Do exercises as told by your doctor. Medicines Take over-the-counter and prescription medicines only as told by your doctor. Ask your doctor if you should avoid driving or using machines while you are taking your medicine. If told, take steps to prevent problems with pooping (constipation). You may need to: Drink enough fluid to keep your pee (urine) pale yellow. Take medicines. You will be told what medicines to take. Eat foods that are high in fiber. These include beans, whole grains, and fresh fruits and vegetables. Limit foods that are high in fat and sugar. These include fried or sweet foods. General instructions Do not smoke or use any products that contain nicotine or tobacco. If you need help quitting, ask your doctor. Keep all follow-up visits. Your doctor will check on your injury and activity. How is this prevented? To prevent a future mid-back injury: Always warm up before physical activity or sports. Cool down and stretch after being  active. Use correct form when playing sports and lifting heavy objects. Bend your knees before you lift heavy objects. Use good posture when sitting and standing. Stay physically fit and stay at a healthy weight. Do at least 150 minutes of moderate-intensity exercise each week, such as brisk walking or water aerobics. Do strength exercises at least 2 times each week. Contact a doctor if: Your pain is not helped by medicine. Your pain or stiffness is getting  worse. You have pain or stiffness in your neck or lower back. Get help right away if: You have shortness of breath. You have chest pain. You have weakness, tingling, or loss of feeling (numbness) in your legs. You are not able to control when you pee. These symptoms may be an emergency. Get help right away. Call 911. Do not wait to see if the symptoms will go away. Do not drive yourself to the hospital. This information is not intended to replace advice given to you by your health care provider. Make sure you discuss any questions you have with your health care provider. Document Revised: 01/11/2023 Document Reviewed: 04/27/2022 Elsevier Patient Education  2024 [employer].

## 2023-04-12 NOTE — Telephone Encounter (Signed)
Patient aware and verbalizes undrstanding

## 2023-04-13 LAB — CMP14+EGFR
ALT: 13 IU/L (ref 0–32)
AST: 22 IU/L (ref 0–40)
Albumin: 4.5 g/dL (ref 3.9–4.9)
Alkaline Phosphatase: 56 IU/L (ref 44–121)
BUN/Creatinine Ratio: 19 (ref 12–28)
BUN: 11 mg/dL (ref 8–27)
Bilirubin Total: 0.4 mg/dL (ref 0.0–1.2)
CO2: 24 mmol/L (ref 20–29)
Calcium: 10 mg/dL (ref 8.7–10.3)
Chloride: 103 mmol/L (ref 96–106)
Creatinine, Ser: 0.57 mg/dL (ref 0.57–1.00)
Globulin, Total: 2 g/dL (ref 1.5–4.5)
Glucose: 81 mg/dL (ref 70–99)
Potassium: 3.9 mmol/L (ref 3.5–5.2)
Sodium: 142 mmol/L (ref 134–144)
Total Protein: 6.5 g/dL (ref 6.0–8.5)
eGFR: 103 mL/min/{1.73_m2} (ref >59)

## 2023-04-13 LAB — CBC
Hematocrit: 37.8 % (ref 34.0–46.6)
Hemoglobin: 12.8 g/dL (ref 11.1–15.9)
MCH: 30.3 pg (ref 26.6–33.0)
MCHC: 33.9 g/dL (ref 31.5–35.7)
MCV: 89 fL (ref 79–97)
Platelets: 237 10*3/uL (ref 150–450)
RBC: 4.23 x10E6/uL (ref 3.77–5.28)
RDW: 12.3 % (ref 11.7–15.4)
WBC: 7.7 10*3/uL (ref 3.4–10.8)

## 2023-04-13 LAB — HEPATITIS C ANTIBODY: Hep C Virus Ab: NONREACTIVE

## 2023-04-13 LAB — HIV ANTIBODY (ROUTINE TESTING W REFLEX): HIV Screen 4th Generation wRfx: NONREACTIVE

## 2023-04-13 LAB — VITAMIN D 25 HYDROXY (VIT D DEFICIENCY, FRACTURES): Vit D, 25-Hydroxy: 54.3 ng/mL (ref 30.0–100.0)

## 2023-04-15 ENCOUNTER — Telehealth: Payer: Self-pay | Admitting: Family Medicine

## 2023-04-19 ENCOUNTER — Encounter: Payer: Self-pay | Admitting: Family Medicine

## 2023-04-20 ENCOUNTER — Other Ambulatory Visit: Payer: Self-pay | Admitting: Family Medicine

## 2023-04-20 DIAGNOSIS — M62830 Muscle spasm of back: Secondary | ICD-10-CM

## 2023-04-20 MED ORDER — CYCLOBENZAPRINE HCL 10 MG PO TABS: 10.0000 mg | ORAL_TABLET | Freq: Three times a day (TID) | ORAL | 0 refills | Status: DC | PRN

## 2023-04-20 NOTE — Telephone Encounter (Signed)
Patient was incorrectly scheduled with Centralized Scheduling. Patient has been rescheduled to [facility] and I have LM for her regarding Appt.

## 2023-04-26 ENCOUNTER — Ambulatory Visit: Payer: BC Managed Care – PPO

## 2023-05-27 ENCOUNTER — Ambulatory Visit (HOSPITAL_COMMUNITY)
Admission: RE | Admit: 2023-05-27 | Discharge: 2023-05-27 | Disposition: A | Discharge status: Home / Self Care | Payer: BC Managed Care – PPO | Source: Ambulatory Visit | Attending: Family Medicine | Admitting: Family Medicine

## 2023-05-27 DIAGNOSIS — R918 Other nonspecific abnormal finding of lung field: Secondary | ICD-10-CM | POA: Diagnosis not present

## 2023-05-27 DIAGNOSIS — R911 Solitary pulmonary nodule: Secondary | ICD-10-CM | POA: Insufficient documentation

## 2023-05-27 DIAGNOSIS — I700 Atherosclerosis of aorta: Secondary | ICD-10-CM | POA: Diagnosis not present

## 2023-06-02 ENCOUNTER — Telehealth: Payer: Self-pay | Admitting: Family Medicine

## 2023-06-03 NOTE — Telephone Encounter (Signed)
This has not resulted.

## 2023-06-04 ENCOUNTER — Encounter: Payer: Self-pay | Admitting: Family Medicine

## 2023-06-28 ENCOUNTER — Encounter: Payer: Self-pay | Admitting: Family Medicine

## 2023-06-29 NOTE — Telephone Encounter (Signed)
I was under the impression we do not care the RSV shot?

## 2023-07-08 ENCOUNTER — Telehealth: Payer: Self-pay | Admitting: Family Medicine

## 2023-07-08 NOTE — Telephone Encounter (Signed)
Patient aware she is due in November. Patient states she get hers through [place] she is checking to see if we have it worked out through Community education officer.

## 2023-07-13 ENCOUNTER — Ambulatory Visit (INDEPENDENT_AMBULATORY_CARE_PROVIDER_SITE_OTHER): Payer: BC Managed Care – PPO

## 2023-07-13 DIAGNOSIS — M810 Age-related osteoporosis without current pathological fracture: Secondary | ICD-10-CM

## 2023-07-15 DIAGNOSIS — Z1283 Encounter for screening for malignant neoplasm of skin: Secondary | ICD-10-CM | POA: Diagnosis not present

## 2023-07-15 DIAGNOSIS — D225 Melanocytic nevi of trunk: Secondary | ICD-10-CM | POA: Diagnosis not present

## 2023-07-15 DIAGNOSIS — X32XXXD Exposure to sunlight, subsequent encounter: Secondary | ICD-10-CM | POA: Diagnosis not present

## 2023-07-15 DIAGNOSIS — L570 Actinic keratosis: Secondary | ICD-10-CM | POA: Diagnosis not present

## 2023-07-16 DIAGNOSIS — Z780 Asymptomatic menopausal state: Secondary | ICD-10-CM | POA: Diagnosis not present

## 2023-07-16 DIAGNOSIS — M8589 Other specified disorders of bone density and structure, multiple sites: Secondary | ICD-10-CM | POA: Diagnosis not present

## 2023-07-21 ENCOUNTER — Telehealth: Payer: Self-pay | Admitting: Family Medicine

## 2023-07-21 DIAGNOSIS — M810 Age-related osteoporosis without current pathological fracture: Secondary | ICD-10-CM

## 2023-07-21 MED ORDER — DENOSUMAB 60 MG/ML ~~LOC~~ SOSY
60.0000 mg | PREFILLED_SYRINGE | Freq: Once | SUBCUTANEOUS | Status: AC
Start: 2023-08-04 — End: 2023-08-16
  Administered 2023-08-16: 60 mg via SUBCUTANEOUS

## 2023-07-21 NOTE — Telephone Encounter (Signed)
Prolia ordered. Patient is due in November Dx: Age-related osteoporosis without current pathological fracture [M81.0]  Provider: Dr. Nadine Counts

## 2023-08-04 ENCOUNTER — Telehealth: Payer: Self-pay

## 2023-08-04 NOTE — Telephone Encounter (Signed)
 Prolia VOB initiated via [email or web address]  Next Prolia inj DUE: 08/04/23

## 2023-08-09 ENCOUNTER — Telehealth: Payer: Self-pay | Admitting: Family Medicine

## 2023-08-09 NOTE — Telephone Encounter (Signed)
Copied from CRM [id number]. Topic: General - Call Back - No Documentation >> Aug 09, 2023  2:18 PM Mosetta Putt H wrote: Reason for CRM: needs to know if prolia is in

## 2023-08-16 ENCOUNTER — Ambulatory Visit (INDEPENDENT_AMBULATORY_CARE_PROVIDER_SITE_OTHER): Payer: BC Managed Care – PPO

## 2023-08-16 DIAGNOSIS — M810 Age-related osteoporosis without current pathological fracture: Secondary | ICD-10-CM | POA: Diagnosis not present

## 2023-08-16 NOTE — Progress Notes (Signed)
Prolia injection given to left upper arm.  Patient tolerated well.  Buy and bill

## 2023-10-26 ENCOUNTER — Telehealth: Payer: BC Managed Care – PPO | Admitting: Nurse Practitioner

## 2023-10-26 ENCOUNTER — Telehealth: Payer: Self-pay | Admitting: Family Medicine

## 2023-10-26 ENCOUNTER — Encounter: Payer: Self-pay | Admitting: Nurse Practitioner

## 2023-10-26 DIAGNOSIS — R0981 Nasal congestion: Secondary | ICD-10-CM | POA: Insufficient documentation

## 2023-10-26 DIAGNOSIS — J069 Acute upper respiratory infection, unspecified: Secondary | ICD-10-CM | POA: Diagnosis not present

## 2023-10-26 DIAGNOSIS — J101 Influenza due to other identified influenza virus with other respiratory manifestations: Secondary | ICD-10-CM

## 2023-10-26 DIAGNOSIS — J3489 Other specified disorders of nose and nasal sinuses: Secondary | ICD-10-CM | POA: Insufficient documentation

## 2023-10-26 DIAGNOSIS — R051 Acute cough: Secondary | ICD-10-CM | POA: Diagnosis not present

## 2023-10-26 LAB — VERITOR FLU A/B WAIVED
Influenza A: POSITIVE — AB
Influenza B: NEGATIVE

## 2023-10-26 MED ORDER — FLUTICASONE PROPIONATE 50 MCG/ACT NA SUSP: 2.0000 | Freq: Every day | NASAL | 6 refills | Status: DC

## 2023-10-26 MED ORDER — OSELTAMIVIR PHOSPHATE 75 MG PO CAPS
75.0000 mg | ORAL_CAPSULE | Freq: Two times a day (BID) | ORAL | 0 refills | Status: DC
Start: 2023-10-26 — End: 2024-04-17

## 2023-10-26 NOTE — Telephone Encounter (Signed)
 Made patient aware of her positive flu A result, given permission by Nena Hummer. Also made patient aware that per Nena, she is going to send in Tamiflu  to the pharmacy for patient. Patient voiced understanding and is aware that were still waiting to receive COVID result.

## 2023-10-26 NOTE — Progress Notes (Signed)
Virtual Visit via video Note Due to COVID-19 pandemic this visit was conducted virtually. This visit type was conducted due to national recommendations for restrictions regarding the COVID-19 Pandemic (e.g. social distancing, sheltering in place) in an effort to limit this patient's exposure and mitigate transmission in our community. All issues noted in this document were discussed and addressed.  A physical exam was not performed with this format.   I connected with Erika Ray on 10/26/2023 at 1432 by name and DOB and verified that I am speaking with the correct person using two identifiers. Erika Ray is currently located at home  during visit. The provider, Martina Sinner, DNP is located in their home at time of visit.  I discussed the limitations, risks, security and privacy concerns of performing an evaluation and management service by virtual visit and the availability of in person appointments. I also discussed with the patient that there may be a patient responsible charge related to this service. The patient expressed understanding and agreed to proceed.  Subjective:  Patient ID: Erika Ray, female    DOB: [birth year], 63 y.o.   MRN: [id number]  Chief Complaint:  Cough and Headache ("Since yesterday")   HPI: Erika Ray is a 63 y.o. female presenting on 10/26/2023 for Cough and Headache ("Since yesterday")  The patient is a 63 year old female who presents via telehealth with a 1-day history of cough, sore throat, headache, congestion, and myalgia. She denies fever, shortness of breath, and chest pain. The symptoms began while at work, and she reports taking a lozenge and over-the-counter (OTC) cough syrup, which helped to relieve the symptoms. She did not go to work today due to feeling unwell and is requesting a note for work. The patient was instructed to visit the clinic for flu and COVID testing via swab. She denies any significant past medical history or other  notable symptoms at this time.   Relevant past medical, surgical, family, and social history reviewed and updated as indicated.  Allergies and medications reviewed and updated.   Past Medical History:  Diagnosis Date   Broken arm 08/1966   left   Rectal bleeding    Rectal pain    Thrombosed hemorrhoids     Past Surgical History:  Procedure Laterality Date   FRACTURE SURGERY  1967   left arm   HEMORROIDECTOMY  06/11/11   thromb hems    Social History   Socioeconomic History   Marital status: Married    Spouse name: Not on file   Number of children: 0   Years of education: Not on file   Highest education level: Not on file  Occupational History   Not on file  Tobacco Use   Smoking status: Never   Smokeless tobacco: Never  Vaping Use   Vaping status: Never Used  Substance and Sexual Activity   Alcohol use: No   Drug use: No   Sexual activity: Not on file  Other Topics Concern   Not on file  Social History Narrative   Married, no children.  Has a mini [place] sheppard   Works for [employer]    Social Drivers of [employer] Strain: Not on [employer] Insecurity: Not on file  Transportation Needs: Not on file  Physical Activity: Not on file  Stress: Not on file  Social Connections: Not on file  Intimate Partner Violence: Not on file    Outpatient Encounter Medications as of 10/26/2023  Medication  Sig   fluticasone (FLONASE) 50 MCG/ACT nasal spray Place 2 sprays into both nostrils daily.   oseltamivir (TAMIFLU) 75 MG capsule Take 1 capsule (75 mg total) by mouth 2 (two) times daily.   Calcium-Magnesium-Vitamin D (CALCIUM 1200+D3 PO) Take by mouth.   cyclobenzaprine (FLEXERIL) 10 MG tablet Take 1 tablet (10 mg total) by mouth 3 (three) times daily as needed for muscle spasms.   rosuvastatin (CRESTOR) 5 MG tablet Take 1 tablet (5 mg total) by mouth daily.   triamcinolone cream (KENALOG) 0.1 % Apply 1 Application topically 2 (two) times daily.  For up to 7 days per flare   valACYclovir (VALTREX) 500 MG tablet TAKE 1 TABLET 3 TIMES A DAY FOR 7 DAYS   No facility-administered encounter medications on file as of 10/26/2023.    Allergies  Allergen Reactions   Ampicillin Rash   Penicillins Hives    All over the body, except the face.    Review of Systems  Constitutional:  Negative for activity change, appetite change, chills and fever.  HENT:  Positive for congestion and sore throat. Negative for ear pain, postnasal drip, rhinorrhea, sinus pressure and sinus pain.        Scratchy   Respiratory:  Positive for cough.        Non productive relieve with OTC cough suppresant  Cardiovascular:  Negative for chest pain and palpitations.  Gastrointestinal:  Negative for diarrhea, nausea and vomiting.  Musculoskeletal:  Positive for myalgias.  Skin:  Negative for color change and rash.  Neurological:  Positive for headaches. Negative for dizziness and light-headedness.    Observations/Objective: No vital signs or physical exam, this was a virtual health encounter.  Pt alert and oriented, answers all questions appropriately, and able to speak in full sentences.    Assessment and Plan: Erika "Eunice Blase" was seen today for cough and headache.  Diagnoses and all orders for this visit:  Viral URI -     Veritor Flu A/B Waived -     Novel Coronavirus, NAA ([facility]) -     oseltamivir (TAMIFLU) 75 MG capsule; Take 1 capsule (75 mg total) by mouth 2 (two) times daily.  Acute cough -     Veritor Flu A/B Waived -     Novel Coronavirus, NAA ([facility]) -     oseltamivir (TAMIFLU) 75 MG capsule; Take 1 capsule (75 mg total) by mouth 2 (two) times daily.  Nasal congestion with rhinorrhea -     Veritor Flu A/B Waived -     Novel Coronavirus, NAA ([facility]) -     fluticasone (FLONASE) 50 MCG/ACT nasal spray; Place 2 sprays into both nostrils daily.  Influenza A virus present -     oseltamivir (TAMIFLU) 75 MG capsule; Take 1 capsule (75 mg  total) by mouth 2 (two) times daily.   Erika Ray is a 63 year old Caucasian female seen today for viral URI, no acute distress POC FLU positive Flu A will treat with Tamiflu 75 mg BID for 5 days # 10 dispensed; Flonase for nasal congestion; okay to get any otc cough suppressant of choice Work note pick at the clinic COVID result pending  Follow Up Instructions: No follow-ups on file.    I discussed the assessment and treatment plan with the patient. The patient was provided an opportunity to ask questions and all were answered. The patient agreed with the plan and demonstrated an understanding of the instructions.   The patient was advised to call back or seek an in-person evaluation  if the symptoms worsen or if the condition fails to improve as anticipated.  The above assessment and management plan was discussed with the patient. The patient verbalized understanding of and has agreed to the management plan. Patient is aware to call the clinic if they develop any new symptoms or if symptoms persist or worsen. Patient is aware when to return to the clinic for a follow-up visit. Patient educated on when it is appropriate to go to the emergency department.    I provided 12 minutes of time during this video encounter.   Arrie Aran Santa Lighter, DNP [facility] [address] [place], [place] [address] [phone] 10/26/2023

## 2023-10-27 LAB — NOVEL CORONAVIRUS, NAA: SARS-CoV-2, NAA: NOT DETECTED

## 2023-10-28 ENCOUNTER — Telehealth: Payer: Self-pay | Admitting: *Deleted

## 2023-10-28 ENCOUNTER — Other Ambulatory Visit: Payer: Self-pay | Admitting: Nurse Practitioner

## 2023-10-28 DIAGNOSIS — R051 Acute cough: Secondary | ICD-10-CM

## 2023-10-28 MED ORDER — GUAIFENESIN 200 MG PO TABS
200.0000 mg | ORAL_TABLET | Freq: Three times a day (TID) | ORAL | 0 refills | Status: DC | PRN
Start: 2023-10-28 — End: 2024-04-17

## 2023-10-28 NOTE — Progress Notes (Signed)
 Guaifenesin  200 mg 1-tab TID PRN for cough and congestion sent to her pharmacy

## 2023-10-28 NOTE — Telephone Encounter (Signed)
 Pt aware and verbalized understanding.

## 2023-10-28 NOTE — Telephone Encounter (Signed)
 Copied from CRM [id number]. Topic: Clinical - Medication Question >> Oct 28, 2023  9:52 AM Antonio DEL wrote: Reason for CRM: Patient was seen this past Tuesday by NP Nena Deitra Saltness for viral URI, wants to know if she can call her something in to [facility] for her cough

## 2023-11-10 ENCOUNTER — Other Ambulatory Visit: Payer: Self-pay | Admitting: Obstetrics

## 2023-11-10 DIAGNOSIS — Z1231 Encounter for screening mammogram for malignant neoplasm of breast: Secondary | ICD-10-CM

## 2024-01-04 ENCOUNTER — Ambulatory Visit
Admission: RE | Admit: 2024-01-04 | Discharge: 2024-01-04 | Disposition: A | Discharge status: Home / Self Care | Payer: BC Managed Care – PPO | Source: Ambulatory Visit | Attending: Obstetrics | Admitting: Obstetrics

## 2024-01-04 DIAGNOSIS — Z1231 Encounter for screening mammogram for malignant neoplasm of breast: Secondary | ICD-10-CM | POA: Diagnosis not present

## 2024-01-17 ENCOUNTER — Other Ambulatory Visit: Payer: Self-pay

## 2024-01-17 ENCOUNTER — Telehealth: Payer: Self-pay

## 2024-01-17 DIAGNOSIS — M810 Age-related osteoporosis without current pathological fracture: Secondary | ICD-10-CM

## 2024-01-17 MED ORDER — DENOSUMAB 60 MG/ML ~~LOC~~ SOSY
60.0000 mg | PREFILLED_SYRINGE | Freq: Once | SUBCUTANEOUS | Status: AC
  Administered 2024-02-15: 60 mg via SUBCUTANEOUS

## 2024-01-17 NOTE — Progress Notes (Signed)
 Sent to PA team for benefit verification

## 2024-01-17 NOTE — Telephone Encounter (Signed)
 Prolia VOB initiated via [email or web address]  Next Prolia inj DUE: 02/14/24

## 2024-01-24 NOTE — Telephone Encounter (Signed)
 PA submitted via fax. Fax #: [phone]

## 2024-01-24 NOTE — Telephone Encounter (Signed)
 Erika Ray

## 2024-01-25 ENCOUNTER — Other Ambulatory Visit (HOSPITAL_COMMUNITY): Payer: Self-pay

## 2024-01-25 NOTE — Telephone Encounter (Signed)
 Pt ready for scheduling for PROLIA  on or after : 02/14/24  Option# 1: Buy/Bill (Office supplied medication)  Out-of-pocket cost due at time of clinic visit: $50  Number of injection/visits approved: 2  Primary: [employer]COMM[employer] Prolia  co-insurance: 0% Admin fee co-insurance: $50  Secondary: --- Prolia  co-insurance:  Admin fee co-insurance:   Medical Benefit Details: Date Benefits were checked: 01/24/24 Deductible: NO/ Coinsurance: 0%/ Admin Fee: $50  Prior Auth: APPROVED PA# [id number] Expiration Date: 01/24/24-01/23/25  # of doses approved: 2 ----------------------------------------------------------------------- Option# 2- Med Obtained from pharmacy:  Pharmacy benefit: Copay $--- (Paid to pharmacy) Admin Fee: --- (Pay at clinic)  Prior Auth: --- PA# Expiration Date:   # of doses approved:   If patient wants fill through the pharmacy benefit please send prescription to:  --- , and include estimated need by date in rx notes. Pharmacy will ship medication directly to the office.  Patient IS eligible for Prolia  Copay Card. Copay Card can make patient's cost as little as $25. Link to apply: [email or web address]://[email or web address]  ** This summary of benefits is an estimation of the patient's out-of-pocket cost. Exact cost may very based on individual plan coverage.

## 2024-01-25 NOTE — Telephone Encounter (Signed)
 Additional information has been requested from the patient's insurance in order to proceed with the prior authorization request. Requested information has been sent, or form has been filled out and faxed back to [phone]

## 2024-02-09 ENCOUNTER — Telehealth: Payer: Self-pay

## 2024-02-09 NOTE — Telephone Encounter (Signed)
 Copied from CRM [id number]. Topic: General - Other >> Feb 09, 2024  4:19 PM Santiya F wrote: Reason for CRM: Patient is calling in because she had a Prolia  shot back in November and she is supposed to have one every 6 months and she hasn't heard anything regarding her next injection and wanted to know when she was supposed to come back and have it, she says the office usually reaches out to her.

## 2024-02-10 NOTE — Telephone Encounter (Signed)
 Appt 02/15/2024 patient aware

## 2024-02-15 ENCOUNTER — Other Ambulatory Visit: Payer: Self-pay

## 2024-02-15 ENCOUNTER — Other Ambulatory Visit

## 2024-02-15 ENCOUNTER — Ambulatory Visit (INDEPENDENT_AMBULATORY_CARE_PROVIDER_SITE_OTHER)

## 2024-02-15 ENCOUNTER — Telehealth: Payer: Self-pay | Admitting: Family Medicine

## 2024-02-15 DIAGNOSIS — M810 Age-related osteoporosis without current pathological fracture: Secondary | ICD-10-CM

## 2024-02-15 DIAGNOSIS — E200 Idiopathic hypoparathyroidism: Secondary | ICD-10-CM

## 2024-02-15 DIAGNOSIS — E7800 Pure hypercholesterolemia, unspecified: Secondary | ICD-10-CM

## 2024-02-15 NOTE — Progress Notes (Signed)
 Patient is in office today for a nurse visit for Prolia  injection. Patient injection was given in the abdominal tissue. Patient tolerated injection well.

## 2024-02-15 NOTE — Telephone Encounter (Signed)
 Labs pended sent to pcp

## 2024-02-18 DIAGNOSIS — Z01419 Encounter for gynecological examination (general) (routine) without abnormal findings: Secondary | ICD-10-CM | POA: Diagnosis not present

## 2024-02-28 ENCOUNTER — Other Ambulatory Visit

## 2024-02-28 DIAGNOSIS — M810 Age-related osteoporosis without current pathological fracture: Secondary | ICD-10-CM

## 2024-02-28 DIAGNOSIS — E200 Idiopathic hypoparathyroidism: Secondary | ICD-10-CM | POA: Diagnosis not present

## 2024-02-28 DIAGNOSIS — E7800 Pure hypercholesterolemia, unspecified: Secondary | ICD-10-CM | POA: Diagnosis not present

## 2024-02-29 ENCOUNTER — Ambulatory Visit: Payer: Self-pay | Admitting: Family Medicine

## 2024-02-29 LAB — LIPID PANEL
Chol/HDL Ratio: 2 ratio (ref 0.0–4.4)
Cholesterol, Total: 165 mg/dL (ref 100–199)
HDL: 84 mg/dL (ref >39)
LDL Chol Calc (NIH): 69 mg/dL (ref 0–99)
Triglycerides: 58 mg/dL (ref 0–149)
VLDL Cholesterol Cal: 12 mg/dL (ref 5–40)

## 2024-02-29 LAB — CBC
Hematocrit: 39.5 % (ref 34.0–46.6)
Hemoglobin: 13.2 g/dL (ref 11.1–15.9)
MCH: 30.6 pg (ref 26.6–33.0)
MCHC: 33.4 g/dL (ref 31.5–35.7)
MCV: 92 fL (ref 79–97)
Platelets: 244 10*3/uL (ref 150–450)
RBC: 4.31 x10E6/uL (ref 3.77–5.28)
RDW: 12.2 % (ref 11.7–15.4)
WBC: 7.7 10*3/uL (ref 3.4–10.8)

## 2024-02-29 LAB — CMP14+EGFR
ALT: 11 IU/L (ref 0–32)
AST: 18 IU/L (ref 0–40)
Albumin: 4.7 g/dL (ref 3.9–4.9)
Alkaline Phosphatase: 62 IU/L (ref 44–121)
BUN/Creatinine Ratio: 17 (ref 12–28)
BUN: 11 mg/dL (ref 8–27)
Bilirubin Total: 0.5 mg/dL (ref 0.0–1.2)
CO2: 21 mmol/L (ref 20–29)
Calcium: 9.2 mg/dL (ref 8.7–10.3)
Chloride: 102 mmol/L (ref 96–106)
Creatinine, Ser: 0.65 mg/dL (ref 0.57–1.00)
Globulin, Total: 1.9 g/dL (ref 1.5–4.5)
Glucose: 81 mg/dL (ref 70–99)
Potassium: 4 mmol/L (ref 3.5–5.2)
Sodium: 139 mmol/L (ref 134–144)
Total Protein: 6.6 g/dL (ref 6.0–8.5)
eGFR: 99 mL/min/{1.73_m2} (ref >59)

## 2024-02-29 LAB — VITAMIN D 25 HYDROXY (VIT D DEFICIENCY, FRACTURES): Vit D, 25-Hydroxy: 54.2 ng/mL (ref 30.0–100.0)

## 2024-02-29 LAB — TSH: TSH: 2.31 u[IU]/mL (ref 0.450–4.500)

## 2024-04-13 ENCOUNTER — Other Ambulatory Visit

## 2024-04-14 ENCOUNTER — Encounter: Payer: BC Managed Care – PPO | Admitting: Family Medicine

## 2024-04-17 ENCOUNTER — Encounter: Payer: Self-pay | Admitting: Family Medicine

## 2024-04-17 ENCOUNTER — Ambulatory Visit (INDEPENDENT_AMBULATORY_CARE_PROVIDER_SITE_OTHER): Payer: BC Managed Care – PPO | Admitting: Family Medicine

## 2024-04-17 VITALS — BP 104/56 | HR 70 | Temp 98.6°F | Ht 62.0 in | Wt 147.0 lb

## 2024-04-17 DIAGNOSIS — M810 Age-related osteoporosis without current pathological fracture: Secondary | ICD-10-CM

## 2024-04-17 DIAGNOSIS — Z0000 Encounter for general adult medical examination without abnormal findings: Secondary | ICD-10-CM

## 2024-04-17 DIAGNOSIS — E200 Idiopathic hypoparathyroidism: Secondary | ICD-10-CM | POA: Diagnosis not present

## 2024-04-17 DIAGNOSIS — L2089 Other atopic dermatitis: Secondary | ICD-10-CM

## 2024-04-17 DIAGNOSIS — E7800 Pure hypercholesterolemia, unspecified: Secondary | ICD-10-CM | POA: Diagnosis not present

## 2024-04-17 DIAGNOSIS — Z0001 Encounter for general adult medical examination with abnormal findings: Secondary | ICD-10-CM

## 2024-04-17 DIAGNOSIS — B029 Zoster without complications: Secondary | ICD-10-CM

## 2024-04-17 DIAGNOSIS — E663 Overweight: Secondary | ICD-10-CM

## 2024-04-17 MED ORDER — ROSUVASTATIN CALCIUM 5 MG PO TABS: 5.0000 mg | ORAL_TABLET | Freq: Every day | ORAL | 3 refills | Status: AC

## 2024-04-17 MED ORDER — TRIAMCINOLONE ACETONIDE 0.1 % EX CREA: 1.0000 | TOPICAL_CREAM | Freq: Two times a day (BID) | CUTANEOUS | 0 refills | Status: AC

## 2024-04-17 MED ORDER — VALACYCLOVIR HCL 500 MG PO TABS: ORAL_TABLET | ORAL | 4 refills | Status: AC

## 2024-04-17 NOTE — Progress Notes (Signed)
 Erika Ray is a 63 y.o. female presents to office today for annual physical exam examination.    Concerns today include: 1.  None.  She is doing well.  Needs form filled out for pressed glass.  She is still interested in some weight loss but is not sure what her insurance will cover.  She was getting compounded semaglutide  orally a couple of years ago and that was really working well for her.  However, since discontinuing it she has gained 10 of the 30 pounds that she had lost.  There is a strong family history of heart disease and so she was wondering if maybe she was a candidate for one of the prescription medicines  Occupation: Works at [employer]  now, Marital status: Married, Substance use: None There are no preventive care reminders to display for this patient. Refills needed today: All  Immunization History  Administered Date(s) Administered   Influenza,inj,Quad PF,6+ Mos 09/02/2015, 07/20/2017, 08/12/2018, 07/12/2020, 07/01/2021, 07/10/2022   Influenza,inj,quad, With Preservative 06/19/2019   Influenza-Unspecified 06/09/2019, 06/27/2023   PFIZER(Purple Top)SARS-COV-2 Vaccination 11/22/2019, 12/06/2019, 12/27/2019, 08/08/2020, 01/23/2021, 07/16/2021, 08/21/2022   Pfizer(Comirnaty)Fall Seasonal Vaccine 12 years and older 06/27/2023   RSV,unspecified 07/24/2023   Respiratory Syncytial Virus Vaccine,Recomb Aduvanted(Arexvy) 07/24/2023   Td 06/08/2022   Zoster Recombinant(Shingrix ) 02/04/2017, 04/29/2017   Past Medical History:  Diagnosis Date   Broken arm 08/21/1966   left   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  Has a mini [place] sheppard   Works for [employer]    Social Drivers of [employer]: Not on [employer] Insecurity: Not on file  Transportation Needs: Not on file  Physical Activity: Not on file  Stress: Not on file  Social Connections: Not on file  Intimate Partner Violence: Not on file   Past Surgical History:  Procedure Laterality Date   FRACTURE SURGERY  1967   left arm   HEMORROIDECTOMY  06/11/11   thromb hems   Family History  Problem Relation Age of Onset   Cancer Mother 72       ovarian   Heart disease Father 35   COPD Father    Cancer Maternal Grandmother 63       breast   Thyroid  disease Maternal Grandmother    Thyroid  disease Maternal Aunt    Thyroid  disease Maternal Uncle     Current Outpatient Medications:    Calcium -Magnesium-Vitamin D  (CALCIUM  1200+D3 PO), Take by mouth., Disp: , Rfl:    rosuvastatin  (CRESTOR ) 5 MG tablet, Take 1 tablet (5 mg total) by mouth daily., Disp: 90 tablet, Rfl: 3   triamcinolone  cream (KENALOG ) 0.1 %, Apply 1 Application topically 2 (two) times daily. For up  to 7 days per flare, Disp: 30 g, Rfl: 0   valACYclovir  (VALTREX ) 500 MG tablet, TAKE 1 TABLET 3 TIMES A DAY FOR 7 DAYS, Disp: 21 tablet, Rfl: 4  Allergies  Allergen Reactions   Ampicillin Rash   Penicillins Hives    All over the body, except the face.     ROS: Review of Systems Pertinent items noted in HPI and remainder of comprehensive ROS otherwise negative.    Physical exam BP (!) 104/56   Pulse 70   Temp 98.6 F (37 C)   Ht 5' 2 (1.575 m)   Wt 147 lb (66.7 kg)   LMP 10/22/2014   SpO2 96%   BMI 26.89 kg/m  General appearance: alert, cooperative,  appears stated age, and no distress Head: Normocephalic, without obvious abnormality, atraumatic Eyes: negative findings: lids and lashes normal, conjunctivae and sclerae normal, corneas clear, and pupils equal, round, reactive to light and accomodation Ears: normal TM's and external ear canals both ears Nose: Nares normal. Septum midline. Mucosa normal. No drainage or sinus tenderness. Throat: lips, mucosa, and tongue normal; teeth and gums normal Neck: no adenopathy, no carotid bruit, supple, symmetrical, trachea midline, and thyroid  not enlarged, symmetric, no tenderness/mass/nodules Back: symmetric, no curvature. ROM normal. No CVA tenderness. Lungs: clear to auscultation bilaterally Heart: regular rate and rhythm, S1, S2 normal, no murmur, click, rub or gallop Abdomen: soft, non-tender; bowel sounds normal; no masses,  no organomegaly Extremities: extremities normal, atraumatic, no cyanosis or edema Pulses: 2+ and symmetric Skin: She has several insect bites noted throughout the upper extremities and lower extremities that have some hyperemia but no evidence of secondary infection or complication. Lymph nodes: Cervical, supraclavicular, and axillary nodes normal. Neurologic: Grossly normal      04/17/2024    3:17 PM 04/12/2023    3:37 PM 08/26/2022    2:46 PM  Depression screen PHQ 2/9  Decreased Interest 0 0 0  Down, Depressed, Hopeless 0 0 0  PHQ - 2 Score 0 0 0  Altered sleeping 0 0   Tired, decreased energy 0 0   Change in appetite 0 0   Feeling bad or failure about yourself  0 0   Trouble concentrating 0 0   Moving slowly or fidgety/restless 0 0   Suicidal thoughts 0 0   PHQ-9 Score 0 0   Difficult doing work/chores Not difficult at all Not difficult at all       04/17/2024    3:17 PM 04/12/2023    3:37 PM 08/26/2022    2:46 PM 06/08/2022    2:50 PM  GAD 7 : Generalized Anxiety Score  Nervous, Anxious, on Edge 0 0 0 0  Control/stop worrying 0 0 0 0  Worry too much -  different things 0 0 0 0  Trouble relaxing 0 0 0 0  Restless 0 0 0 0  Easily annoyed or irritable 0 0 0 0  Afraid - awful might happen 0 0 0 0  Total GAD 7 Score 0 0 0 0  Anxiety Difficulty Not difficult at all Not difficult at all Not difficult at all Not difficult at all   Recent Results (from the past 2160 hours)  CBC     Status: None   Collection Time: 02/28/24  2:31 PM  Result Value Ref Range   WBC 7.7 3.4 - 10.8 x10E3/uL   RBC 4.31 3.77 - 5.28 x10E6/uL   Hemoglobin 13.2 11.1 - 15.9 g/dL   Hematocrit 60.4 65.9 -  46.6 %   MCV 92 79 - 97 fL   MCH 30.6 26.6 - 33.0 pg   MCHC 33.4 31.5 - 35.7 g/dL   RDW 87.7 88.2 - 84.5 %   Platelets 244 150 - 450 x10E3/uL  VITAMIN D  25 Hydroxy (Vit-D Deficiency, Fractures)     Status: None   Collection Time: 02/28/24  2:31 PM  Result Value Ref Range   Vit D, 25-Hydroxy 54.2 30.0 - 100.0 ng/mL    Comment: Vitamin D  deficiency has been defined by the Institute of Medicine and an Endocrine Society practice guideline as a level of serum 25-OH vitamin D  less than 20 ng/mL (1,2). The Endocrine Society went on to further define vitamin D  insufficiency as a level between 21 and 29 ng/mL (2). 1. IOM (Institute of Medicine). 2010. Dietary reference    intakes for calcium  and D. [place]: The    [employer]. 2. Holick MF, Binkley [place], Bischoff-Ferrari HA, et al.    Evaluation, treatment, and prevention of vitamin D     deficiency: an Endocrine Society clinical practice    guideline. JCEM. 2011 Jul; 96(7):1911-30.   CMP14+EGFR     Status: None   Collection Time: 02/28/24  2:31 PM  Result Value Ref Range   Glucose 81 70 - 99 mg/dL   BUN 11 8 - 27 mg/dL   Creatinine, Ser 9.34 0.57 - 1.00 mg/dL   eGFR 99 >40 fO/fpw/8.26   BUN/Creatinine Ratio 17 12 - 28   Sodium 139 134 - 144 mmol/L   Potassium 4.0 3.5 - 5.2 mmol/L   Chloride 102 96 - 106 mmol/L   CO2 21 20 - 29 mmol/L   Calcium  9.2 8.7 - 10.3 mg/dL   Total Protein 6.6 6.0 - 8.5  g/dL   Albumin 4.7 3.9 - 4.9 g/dL   Globulin, Total 1.9 1.5 - 4.5 g/dL   Bilirubin Total 0.5 0.0 - 1.2 mg/dL   Alkaline Phosphatase 62 44 - 121 IU/L   AST 18 0 - 40 IU/L   ALT 11 0 - 32 IU/L  TSH     Status: None   Collection Time: 02/28/24  2:31 PM  Result Value Ref Range   TSH 2.310 0.450 - 4.500 uIU/mL  Lipid Panel     Status: None   Collection Time: 02/28/24  2:31 PM  Result Value Ref Range   Cholesterol, Total 165 100 - 199 mg/dL   Triglycerides 58 0 - 149 mg/dL   HDL 84 >60 mg/dL   VLDL Cholesterol Cal 12 5 - 40 mg/dL   LDL Chol Calc (NIH) 69 0 - 99 mg/dL   Chol/HDL Ratio 2.0 0.0 - 4.4 ratio    Comment:                                   T. Chol/HDL Ratio                                             Men  Women                               1/2 Avg.Risk  3.4    3.3  Avg.Risk  5.0    4.4                                2X Avg.Risk  9.6    7.1                                3X Avg.Risk 23.4   11.0      Assessment/ Plan: Adrien DELENA Aguas here for annual physical exam.   Annual physical exam  Age-related osteoporosis without current pathological fracture  Pure hypercholesterolemia - Plan: rosuvastatin  (CRESTOR ) 5 MG tablet  Idiopathic hypoparathyroidism (HCC)  Other atopic dermatitis - Plan: triamcinolone  cream (KENALOG ) 0.1 %  Herpes zoster without complication - Plan: valACYclovir  (VALTREX ) 500 MG tablet  Overweight (BMI 25.0-29.9)  Labs reviewed.  Continue all medications as prescribed.  Refill sent. Chronic issues are stable She will contact her insurance company to see if anything is covered for weight  Counseled on healthy lifestyle choices, including diet (rich in fruits, vegetables and lean meats and low in salt and simple carbohydrates) and exercise (at least 30 minutes of moderate physical activity daily).  Patient to follow up 1 year, sooner if concerns arise  Hila Bolding M. Jolinda, DO

## 2024-04-18 ENCOUNTER — Encounter: Payer: Self-pay | Admitting: Family Medicine

## 2024-04-18 ENCOUNTER — Other Ambulatory Visit: Payer: Self-pay | Admitting: Family Medicine

## 2024-04-18 DIAGNOSIS — E663 Overweight: Secondary | ICD-10-CM

## 2024-04-18 DIAGNOSIS — Z8249 Family history of ischemic heart disease and other diseases of the circulatory system: Secondary | ICD-10-CM

## 2024-04-18 DIAGNOSIS — E7800 Pure hypercholesterolemia, unspecified: Secondary | ICD-10-CM

## 2024-04-18 MED ORDER — WEGOVY 0.5 MG/0.5ML ~~LOC~~ SOAJ: 0.5000 mg | SUBCUTANEOUS | 0 refills | Status: DC

## 2024-04-18 MED ORDER — WEGOVY 0.25 MG/0.5ML ~~LOC~~ SOAJ: 0.2500 mg | SUBCUTANEOUS | 0 refills | Status: DC

## 2024-04-19 ENCOUNTER — Other Ambulatory Visit (HOSPITAL_COMMUNITY): Payer: Self-pay

## 2024-04-19 ENCOUNTER — Telehealth: Payer: Self-pay

## 2024-04-19 NOTE — Telephone Encounter (Signed)
 Pharmacy Patient Advocate Encounter  Received notification from [facility] that Prior Authorization for Wegovy  0.25 has been DENIED.  See denial reason below. No denial letter attached in CMM. Will attach denial letter to Media tab once received.   PA #/Case ID/Reference #: [id number]

## 2024-04-19 NOTE — Telephone Encounter (Signed)
 Pt has been made aware , will call her insurance and ley us  know what they say

## 2024-04-19 NOTE — Telephone Encounter (Signed)
 Please inform below.  She will need to contact her insurance again to see if there is anything that they cover for weight loss.

## 2024-04-19 NOTE — Telephone Encounter (Signed)
 Pharmacy Patient Advocate Encounter   Received notification from CoverMyMeds that prior authorization for Wegovy  0.25 is required/requested.   Insurance verification completed.   The patient is insured through [facility] . Included family myocardial note   Per test claim: PA required; PA submitted to above mentioned insurance via CoverMyMeds Key/confirmation #/EOC [id number] Status is pending

## 2024-06-29 ENCOUNTER — Ambulatory Visit (INDEPENDENT_AMBULATORY_CARE_PROVIDER_SITE_OTHER): Admitting: *Deleted

## 2024-06-29 DIAGNOSIS — Z23 Encounter for immunization: Secondary | ICD-10-CM | POA: Diagnosis not present

## 2024-07-06 ENCOUNTER — Encounter: Payer: Self-pay | Admitting: Family Medicine

## 2024-07-19 ENCOUNTER — Telehealth: Payer: Self-pay

## 2024-07-19 DIAGNOSIS — M810 Age-related osteoporosis without current pathological fracture: Secondary | ICD-10-CM

## 2024-07-19 MED ORDER — DENOSUMAB 60 MG/ML ~~LOC~~ SOSY
60.0000 mg | PREFILLED_SYRINGE | SUBCUTANEOUS | Status: AC
  Administered 2024-08-22: 60 mg via SUBCUTANEOUS

## 2024-07-19 NOTE — Telephone Encounter (Signed)
 Prolia  sent for benefit verification

## 2024-07-19 NOTE — Telephone Encounter (Signed)
 Prolia  VOB initiated via [email or web address]  Next Prolia  inj DUE: 08/17/24

## 2024-07-24 ENCOUNTER — Other Ambulatory Visit (HOSPITAL_COMMUNITY): Payer: Self-pay

## 2024-07-24 NOTE — Telephone Encounter (Signed)
 SABRA

## 2024-07-24 NOTE — Telephone Encounter (Signed)
 Pt ready for scheduling for PROLIA  on or after : 08/17/24  Option# 1: Buy/Bill (Office supplied medication)  Out-of-pocket cost due at time of clinic visit: $0  Number of injection/visits approved: 2  Primary: [employer] Prolia  co-insurance: 0% Admin fee co-insurance: 0%  Secondary: --- Prolia  co-insurance:  Admin fee co-insurance:   Medical Benefit Details: Date Benefits were checked: 07/21/24 Deductible: NO/ Coinsurance: 0%/ Admin Fee: 0%  Prior Auth: APPROVED PA# [id number] Expiration Date: 01/24/24-01/23/25  # of doses approved: 2 ----------------------------------------------------------------------- Option# 2- Med Obtained from pharmacy:  Pharmacy benefit: Copay $--- (Paid to pharmacy) Admin Fee: --- (Pay at clinic)  Prior Auth: --- PA# Expiration Date:   # of doses approved:   If patient wants fill through the pharmacy benefit please send prescription to: ---, and include estimated need by date in rx notes. Pharmacy will ship medication directly to the office.  Patient --- eligible for Prolia  Copay Card. Copay Card can make patient's cost as little as $25. Link to apply: [email or web address]://[email or web address]  ** This summary of benefits is an estimation of the patient's out-of-pocket cost. Exact cost may very based on individual plan coverage.

## 2024-07-25 ENCOUNTER — Telehealth: Admitting: Family Medicine

## 2024-07-25 DIAGNOSIS — A084 Viral intestinal infection, unspecified: Secondary | ICD-10-CM

## 2024-07-25 MED ORDER — ONDANSETRON 4 MG PO TBDP: 4.0000 mg | ORAL_TABLET | Freq: Three times a day (TID) | ORAL | 0 refills | Status: AC | PRN

## 2024-07-25 NOTE — Progress Notes (Signed)
 MyChart Video visit  Subjective: RR:[id number] PCP: Erika Erika HERO, DO YEP:Erika Erika is a 63 y.o. female. Patient provides verbal consent for consult held via video.  Due to COVID-19 pandemic this visit was conducted virtually. This visit type was conducted due to national recommendations for restrictions regarding the COVID-19 Pandemic (e.g. social distancing, sheltering in place) in an effort to limit this patient's exposure and mitigate transmission in our community. All issues noted in this document were discussed and addressed.  A physical exam was not performed with this format.   Location of patient: home Location of provider: [facility] Others present for call: none  1. Diarrhea She reports onset nonbloody diarrhea yesterday am and was going 7 times per day yesterday.  She reports associated nausea. No vomiting. No known sick contacts.  No consumption of undercooked foods/ spoiled foods.  She used pepto (didn't help) and then took imodium which has helped lessen the frequency of diarrhea as she has only had a couple of episodes now.  Denies any abdominal pain.  She was able to keep toast today.  She is hydrating without difficulty.   ROS: Per HPI  Allergies  Allergen Reactions   Ampicillin Rash   Penicillins Hives    All over the body, except the face.   Past Medical History:  Diagnosis Date   Broken arm 08/21/1966   left   Pathological fracture of left ankle due to osteoporosis with delayed healing 03/30/2017   Rectal bleeding    Rectal pain    Thrombosed hemorrhoids     Current Outpatient Medications:    Calcium -Magnesium-Vitamin D  (CALCIUM  1200+D3 PO), Take by mouth., Disp: , Rfl:    rosuvastatin  (CRESTOR ) 5 MG tablet, Take 1 tablet (5 mg total) by mouth daily., Disp: 90 tablet, Rfl: 3   Semaglutide -Weight Management (WEGOVY ) 0.25 MG/0.5ML SOAJ, Inject 0.25 mg into the skin every 7 (seven) days., Disp: 2 mL, Rfl: 0   Semaglutide -Weight Management (WEGOVY ) 0.5  MG/0.5ML SOAJ, Inject 0.5 mg into the skin every 7 (seven) days., Disp: 6 mL, Rfl: 0   triamcinolone  cream (KENALOG ) 0.1 %, Apply 1 Application topically 2 (two) times daily. For up to 7 days per flare, Disp: 30 g, Rfl: 0   valACYclovir  (VALTREX ) 500 MG tablet, TAKE 1 TABLET 3 TIMES A DAY FOR 7 DAYS, Disp: 21 tablet, Rfl: 4  Current Facility-Administered Medications:    [START ON 08/10/2024] denosumab  (PROLIA ) injection 60 mg, 60 mg, Subcutaneous, Q6 months, Erika Pyper M, DO  Gen: Tired but nontoxic-appearing female HEENT: Sclera white.  Assessment/ Plan: 63 y.o. female   Viral gastroenteritis - Plan: ondansetron (ZOFRAN-ODT) 4 MG disintegrating tablet  Presumed viral GI bug.  Push oral fluids.  Ok to use imodium sparingly but discussed may prolong symptoms.  Does not sound like food poisoning but we discussed that she develops any red flag signs or symptoms low threshold to evaluate stool.  Work note provided and placed in EMR  Start time: 1:58pm End time: 2:06pm  Total time spent on patient care (including video visit/ documentation): 10 minutes  Erika Erika Erika Jolinda, DO [facility] [phone]

## 2024-08-02 ENCOUNTER — Telehealth: Payer: Self-pay

## 2024-08-02 NOTE — Telephone Encounter (Signed)
 Copied from CRM [id number]. Topic: Appointments - Scheduling Inquiry for Clinic >> Aug 02, 2024  4:06 PM Antwanette L wrote: Reason for CRM: Patient called to schedule her Prolia  injection and stated she is available any day after 3:00 PM. She also reported receiving a letter from her insurance provider indicating that, starting in January, they would prefer she use an alternative to Prolia . The alternatives listed in the letter were [employer] and [employer]. The patient can be contacted tomorrow after 2pm at [phone]

## 2024-08-07 NOTE — Telephone Encounter (Signed)
 Appt scheduled

## 2024-08-22 ENCOUNTER — Ambulatory Visit (INDEPENDENT_AMBULATORY_CARE_PROVIDER_SITE_OTHER): Admitting: *Deleted

## 2024-08-22 DIAGNOSIS — M810 Age-related osteoporosis without current pathological fracture: Secondary | ICD-10-CM | POA: Diagnosis not present

## 2024-08-22 NOTE — Progress Notes (Signed)
 Patient is in office today for a nurse visit for  Prolia injection . Patient Injection was given in the  Left arm. Patient tolerated injection well.

## 2025-04-20 ENCOUNTER — Encounter: Payer: Self-pay | Admitting: Family Medicine
